# Patient Record
Sex: Female | Born: 1945 | Race: Black or African American | Hispanic: No | Smoking: Never smoker
Health system: Southern US, Community
[De-identification: ages and names within clinical notes are randomized; demographics above are authoritative.]

## PROBLEM LIST (undated history)

## (undated) DIAGNOSIS — Z860100 Personal history of colon polyps, unspecified: Secondary | ICD-10-CM

## (undated) DIAGNOSIS — I10 Essential (primary) hypertension: Secondary | ICD-10-CM

## (undated) DIAGNOSIS — R12 Heartburn: Secondary | ICD-10-CM

## (undated) DIAGNOSIS — G510 Bell's palsy: Secondary | ICD-10-CM

## (undated) DIAGNOSIS — M353 Polymyalgia rheumatica: Secondary | ICD-10-CM

## (undated) DIAGNOSIS — E785 Hyperlipidemia, unspecified: Secondary | ICD-10-CM

## (undated) HISTORY — DX: Polymyalgia rheumatica: M35.3

## (undated) HISTORY — PX: COLONOSCOPY: SHX174

## (undated) HISTORY — DX: Essential (primary) hypertension: I10

## (undated) HISTORY — DX: Bell's palsy: G51.0

## (undated) HISTORY — PX: ABDOMINAL HYSTERECTOMY: SHX81

## (undated) HISTORY — DX: Hyperlipidemia, unspecified: E78.5

## (undated) HISTORY — DX: Personal history of colon polyps, unspecified: Z86.0100

## (undated) HISTORY — DX: Heartburn: R12

## (undated) HISTORY — PX: PARTIAL HYSTERECTOMY: SHX80

## (undated) HISTORY — DX: Personal history of colonic polyps: Z86.010

---

## 2001-07-27 ENCOUNTER — Ambulatory Visit (HOSPITAL_COMMUNITY): Admission: RE | Admit: 2001-07-27 | Discharge: 2001-07-27 | Payer: Self-pay | Admitting: *Deleted

## 2001-07-27 ENCOUNTER — Encounter: Payer: Self-pay | Admitting: *Deleted

## 2004-04-25 ENCOUNTER — Ambulatory Visit: Payer: Self-pay | Admitting: Family Medicine

## 2005-01-27 ENCOUNTER — Ambulatory Visit: Payer: Self-pay | Admitting: Family Medicine

## 2005-05-01 ENCOUNTER — Ambulatory Visit: Payer: Self-pay | Admitting: Family Medicine

## 2006-07-28 ENCOUNTER — Other Ambulatory Visit: Admission: RE | Admit: 2006-07-28 | Discharge: 2006-07-28 | Payer: Self-pay | Admitting: Family Medicine

## 2006-08-03 ENCOUNTER — Encounter: Admission: RE | Admit: 2006-08-03 | Discharge: 2006-08-03 | Payer: Self-pay | Admitting: Family Medicine

## 2007-08-05 ENCOUNTER — Encounter: Admission: RE | Admit: 2007-08-05 | Discharge: 2007-08-05 | Payer: Self-pay | Admitting: Family Medicine

## 2008-08-07 ENCOUNTER — Encounter: Admission: RE | Admit: 2008-08-07 | Discharge: 2008-08-07 | Payer: Self-pay | Admitting: Family Medicine

## 2009-08-20 ENCOUNTER — Encounter: Admission: RE | Admit: 2009-08-20 | Discharge: 2009-08-20 | Payer: Self-pay | Admitting: Family Medicine

## 2009-09-27 ENCOUNTER — Other Ambulatory Visit: Admission: RE | Admit: 2009-09-27 | Discharge: 2009-09-27 | Payer: Self-pay | Admitting: Family Medicine

## 2010-08-02 ENCOUNTER — Other Ambulatory Visit: Payer: Self-pay | Admitting: Family Medicine

## 2010-08-02 DIAGNOSIS — Z1231 Encounter for screening mammogram for malignant neoplasm of breast: Secondary | ICD-10-CM

## 2010-08-22 ENCOUNTER — Ambulatory Visit
Admission: RE | Admit: 2010-08-22 | Discharge: 2010-08-22 | Disposition: A | Discharge status: Home / Self Care | Payer: Private Health Insurance - Indemnity | Source: Ambulatory Visit | Attending: Family Medicine | Admitting: Family Medicine

## 2010-08-22 DIAGNOSIS — Z1231 Encounter for screening mammogram for malignant neoplasm of breast: Secondary | ICD-10-CM

## 2011-08-06 ENCOUNTER — Other Ambulatory Visit: Payer: Self-pay | Admitting: Family Medicine

## 2011-08-06 DIAGNOSIS — Z1231 Encounter for screening mammogram for malignant neoplasm of breast: Secondary | ICD-10-CM

## 2011-08-26 ENCOUNTER — Ambulatory Visit: Payer: Private Health Insurance - Indemnity

## 2011-09-09 ENCOUNTER — Ambulatory Visit
Admission: RE | Admit: 2011-09-09 | Discharge: 2011-09-09 | Disposition: A | Discharge status: Home / Self Care | Payer: Self-pay | Source: Ambulatory Visit | Attending: Family Medicine | Admitting: Family Medicine

## 2011-09-09 DIAGNOSIS — Z1231 Encounter for screening mammogram for malignant neoplasm of breast: Secondary | ICD-10-CM

## 2012-09-20 ENCOUNTER — Other Ambulatory Visit: Payer: Self-pay

## 2012-09-20 DIAGNOSIS — Z1231 Encounter for screening mammogram for malignant neoplasm of breast: Secondary | ICD-10-CM

## 2012-10-21 ENCOUNTER — Ambulatory Visit
Admission: RE | Admit: 2012-10-21 | Discharge: 2012-10-21 | Disposition: A | Discharge status: Home / Self Care | Payer: Managed Care, Other (non HMO) | Source: Ambulatory Visit

## 2012-10-21 DIAGNOSIS — Z1231 Encounter for screening mammogram for malignant neoplasm of breast: Secondary | ICD-10-CM

## 2013-10-05 ENCOUNTER — Other Ambulatory Visit: Payer: Self-pay

## 2013-10-05 DIAGNOSIS — Z1231 Encounter for screening mammogram for malignant neoplasm of breast: Secondary | ICD-10-CM

## 2013-10-26 ENCOUNTER — Ambulatory Visit
Admission: RE | Admit: 2013-10-26 | Discharge: 2013-10-26 | Disposition: A | Discharge status: Home / Self Care | Payer: Medicare HMO | Source: Ambulatory Visit

## 2013-10-26 DIAGNOSIS — Z1231 Encounter for screening mammogram for malignant neoplasm of breast: Secondary | ICD-10-CM

## 2014-02-16 ENCOUNTER — Encounter: Payer: Self-pay | Admitting: General Surgery

## 2014-02-16 DIAGNOSIS — E785 Hyperlipidemia, unspecified: Secondary | ICD-10-CM | POA: Insufficient documentation

## 2014-06-08 ENCOUNTER — Ambulatory Visit (INDEPENDENT_AMBULATORY_CARE_PROVIDER_SITE_OTHER): Payer: Medicare Other | Admitting: Cardiology

## 2014-06-08 ENCOUNTER — Encounter: Payer: Self-pay | Admitting: Cardiology

## 2014-06-08 VITALS — BP 142/78 | HR 67 | Ht 64.0 in | Wt 158.0 lb

## 2014-06-08 DIAGNOSIS — R001 Bradycardia, unspecified: Secondary | ICD-10-CM

## 2014-06-08 DIAGNOSIS — E785 Hyperlipidemia, unspecified: Secondary | ICD-10-CM

## 2014-06-08 DIAGNOSIS — Z8249 Family history of ischemic heart disease and other diseases of the circulatory system: Secondary | ICD-10-CM

## 2014-06-08 NOTE — Patient Instructions (Signed)
The current medical regimen is effective;  continue present plan and medications.  Follow up in 1 year with Dr. Skains.  You will receive a letter in the mail 2 months before you are due.  Please call us when you receive this letter to schedule your follow up appointment.  Thank you for choosing [facility]!!     

## 2014-06-08 NOTE — Progress Notes (Signed)
[address] [place], [place]  [address] Phone: [phone] Fax:  [phone]  Date:  06/08/2014   ID:  Meredith Leach, DOB 1945/12/27, MRN [id number]  PCP:  Meredith Leach   History of Present Illness: Meredith Leach is a 69 y.o. female here for evaluation of bradycardia. Heart rate at last visit with Dr. Merri Brunetteandace Thal was 58 bpm EKG performed demonstrated heart rate of 49 bpm, sinus bradycardia with nonspecific ST-T wave changes. This was no change from prior EKG in 2010. She was referred to us to evaluate whether or not something different needs to be done  LDL 64, sodium 139, potassium 4.4, creatinine 0.7, hemoglobin 13.4   She is currently on no AV nodal blocking agents, no medications to slow her heart rate. She takes Crestor for her hyperlipidemia.  Mother has asymptomatic bradycardia with heart rate in the 30s at times.    Wt Readings from Last 3 Encounters:  06/08/14 158 lb (71.668 kg)     Past Medical History  Diagnosis Date  . Bell's palsy   . Hyperlipidemia   . History of colon polyps     Past Surgical History  Procedure Laterality Date  . Colonoscopy    . Partial hysterectomy      Current Outpatient Prescriptions  Medication Sig Dispense Refill  . aspirin 81 MG tablet Take 81 mg by mouth daily.    . calcium-vitamin D (OSCAL WITH D) 500-200 MG-UNIT per tablet Take 1 tablet by mouth daily with breakfast.    . CRESTOR 20 MG tablet Take 10 mg by mouth daily.  0  . estradiol (ESTRACE) 1 MG tablet Take 1 mg by mouth daily.    Marland Kitchen. omega-3 acid ethyl esters (LOVAZA) 1 G capsule Take 1 g by mouth daily.     No current facility-administered medications for this visit.    Allergies:   No Known Allergies  Social History:  The patient  reports that she has never smoked. She does not have any smokeless tobacco history on file. She reports that she does not drink alcohol or use illicit drugs.   Family History  Problem Relation Age of  Onset  . Alzheimer's disease Mother   . CAD Mother   . COPD Father    Her brother had myocardial infarction and bypass 4 at age 68.  ROS:  Please see the history of present illness.  Positive for Bell's palsy. Recent tennis elbow. Denies any fevers, chills, orthopnea, PND, syncope   All other systems reviewed and negative.   PHYSICAL EXAM: VS:  BP 142/78 mmHg  Pulse 67  Ht 5\' 4"  (1.626 m)  Wt 158 lb (71.668 kg)  BMI 27.11 kg/m2 Well nourished, well developed, in no acute distress HEENT: normal, [place]/AT, EOMI Neck: no JVD, normal carotid upstroke, no bruit Cardiac:  normal S1, S2; RRR; no murmur Lungs:  clear to auscultation bilaterally, no wheezing, rhonchi or rales Abd: soft, nontender, no hepatomegaly, no bruits Ext: no edema, 2+ distal pulses Skin: warm and dry GU: deferred Neuro: Bell's palsy noted, AAO x 3  EKG:  04/24/14 Sinus bradycardia rate 49 with nonspecific ST-T wave changes  ASSESSMENT AND PLAN:  1. Bradycardia-asymptomatic. Bradycardia may have been precipitated by laying down during prior EKG. Vagal phenomenon. Overall, no high risk symptoms such as syncope. Continue to monitor for any signs or symptoms, shortness of breath, inability to raise her heart rate, etc. Her mother, age 68, has had  heart rates in the upper 30s at times, does not have a pacemaker. There may be a familial linked to her current condition. Avoid AV nodal blocking agent such as metoprolol, diltiazem. During next blood work, consider checking TSH if it is not been done recently. She will call me if any worrisome symptoms develop. But pressure mildly elevated today but upon recent check she states that it has been normalized. 2. One-year follow-up to monitor bradycardia.  Signed, Donato Schultz, [facility]  06/08/2014 2:49 PM

## 2014-09-27 ENCOUNTER — Other Ambulatory Visit: Payer: Self-pay

## 2014-09-27 DIAGNOSIS — Z1231 Encounter for screening mammogram for malignant neoplasm of breast: Secondary | ICD-10-CM

## 2014-10-31 ENCOUNTER — Ambulatory Visit
Admission: RE | Admit: 2014-10-31 | Discharge: 2014-10-31 | Disposition: A | Discharge status: Home / Self Care | Payer: Medicare Other | Source: Ambulatory Visit

## 2014-10-31 DIAGNOSIS — Z1231 Encounter for screening mammogram for malignant neoplasm of breast: Secondary | ICD-10-CM

## 2015-03-01 ENCOUNTER — Ambulatory Visit (INDEPENDENT_AMBULATORY_CARE_PROVIDER_SITE_OTHER): Payer: Medicare Other | Admitting: *Deleted

## 2015-03-01 DIAGNOSIS — Z23 Encounter for immunization: Secondary | ICD-10-CM | POA: Diagnosis not present

## 2015-09-24 ENCOUNTER — Other Ambulatory Visit: Payer: Self-pay

## 2015-09-24 DIAGNOSIS — Z1231 Encounter for screening mammogram for malignant neoplasm of breast: Secondary | ICD-10-CM

## 2015-11-01 ENCOUNTER — Ambulatory Visit
Admission: RE | Admit: 2015-11-01 | Discharge: 2015-11-01 | Disposition: A | Discharge status: Home / Self Care | Payer: Medicare Other | Source: Ambulatory Visit

## 2015-11-01 DIAGNOSIS — Z1231 Encounter for screening mammogram for malignant neoplasm of breast: Secondary | ICD-10-CM

## 2016-02-26 ENCOUNTER — Ambulatory Visit (INDEPENDENT_AMBULATORY_CARE_PROVIDER_SITE_OTHER): Payer: Medicare Other | Admitting: *Deleted

## 2016-02-26 DIAGNOSIS — Z23 Encounter for immunization: Secondary | ICD-10-CM | POA: Diagnosis not present

## 2016-09-25 ENCOUNTER — Other Ambulatory Visit: Payer: Self-pay | Admitting: Family Medicine

## 2016-09-25 DIAGNOSIS — Z1231 Encounter for screening mammogram for malignant neoplasm of breast: Secondary | ICD-10-CM

## 2016-11-03 ENCOUNTER — Ambulatory Visit
Admission: RE | Admit: 2016-11-03 | Discharge: 2016-11-03 | Disposition: A | Discharge status: Home / Self Care | Payer: Medicare Other | Source: Ambulatory Visit | Attending: Family Medicine | Admitting: Family Medicine

## 2016-11-03 DIAGNOSIS — Z1231 Encounter for screening mammogram for malignant neoplasm of breast: Secondary | ICD-10-CM

## 2017-03-17 ENCOUNTER — Ambulatory Visit (INDEPENDENT_AMBULATORY_CARE_PROVIDER_SITE_OTHER): Payer: Medicare Other | Admitting: *Deleted

## 2017-03-17 DIAGNOSIS — Z23 Encounter for immunization: Secondary | ICD-10-CM | POA: Diagnosis not present

## 2017-09-30 ENCOUNTER — Other Ambulatory Visit: Payer: Self-pay | Admitting: Family Medicine

## 2017-09-30 DIAGNOSIS — Z1231 Encounter for screening mammogram for malignant neoplasm of breast: Secondary | ICD-10-CM

## 2017-11-05 ENCOUNTER — Ambulatory Visit
Admission: RE | Admit: 2017-11-05 | Discharge: 2017-11-05 | Disposition: A | Discharge status: Home / Self Care | Payer: Medicare Other | Source: Ambulatory Visit | Attending: Family Medicine | Admitting: Family Medicine

## 2017-11-05 DIAGNOSIS — Z1231 Encounter for screening mammogram for malignant neoplasm of breast: Secondary | ICD-10-CM

## 2018-06-11 ENCOUNTER — Other Ambulatory Visit: Payer: Self-pay | Admitting: Family Medicine

## 2018-06-11 DIAGNOSIS — E2839 Other primary ovarian failure: Secondary | ICD-10-CM

## 2018-06-24 ENCOUNTER — Ambulatory Visit
Admission: RE | Admit: 2018-06-24 | Discharge: 2018-06-24 | Disposition: A | Discharge status: Home / Self Care | Payer: Medicare Other | Source: Ambulatory Visit | Attending: Family Medicine | Admitting: Family Medicine

## 2018-06-24 DIAGNOSIS — E2839 Other primary ovarian failure: Secondary | ICD-10-CM

## 2018-10-25 ENCOUNTER — Other Ambulatory Visit: Payer: Self-pay | Admitting: Family Medicine

## 2018-10-25 DIAGNOSIS — Z1231 Encounter for screening mammogram for malignant neoplasm of breast: Secondary | ICD-10-CM

## 2018-12-02 ENCOUNTER — Other Ambulatory Visit: Payer: Self-pay | Admitting: Family Medicine

## 2018-12-02 DIAGNOSIS — I999 Unspecified disorder of circulatory system: Secondary | ICD-10-CM

## 2018-12-09 ENCOUNTER — Ambulatory Visit
Admission: RE | Admit: 2018-12-09 | Discharge: 2018-12-09 | Disposition: A | Discharge status: Home / Self Care | Payer: Medicare Other | Source: Ambulatory Visit | Attending: Family Medicine | Admitting: Family Medicine

## 2018-12-09 ENCOUNTER — Other Ambulatory Visit: Payer: Self-pay

## 2018-12-09 DIAGNOSIS — Z1231 Encounter for screening mammogram for malignant neoplasm of breast: Secondary | ICD-10-CM

## 2018-12-14 ENCOUNTER — Ambulatory Visit
Admission: RE | Admit: 2018-12-14 | Discharge: 2018-12-14 | Disposition: A | Discharge status: Home / Self Care | Payer: Medicare Other | Source: Ambulatory Visit | Attending: Family Medicine | Admitting: Family Medicine

## 2018-12-14 DIAGNOSIS — I999 Unspecified disorder of circulatory system: Secondary | ICD-10-CM

## 2019-11-07 ENCOUNTER — Other Ambulatory Visit: Payer: Self-pay | Admitting: Family Medicine

## 2019-11-07 DIAGNOSIS — Z1231 Encounter for screening mammogram for malignant neoplasm of breast: Secondary | ICD-10-CM

## 2019-12-13 ENCOUNTER — Ambulatory Visit
Admission: RE | Admit: 2019-12-13 | Discharge: 2019-12-13 | Disposition: A | Discharge status: Home / Self Care | Payer: Medicare Other | Source: Ambulatory Visit | Attending: Family Medicine | Admitting: Family Medicine

## 2019-12-13 ENCOUNTER — Other Ambulatory Visit: Payer: Self-pay

## 2019-12-13 DIAGNOSIS — Z1231 Encounter for screening mammogram for malignant neoplasm of breast: Secondary | ICD-10-CM

## 2020-07-03 DIAGNOSIS — Z0000 Encounter for general adult medical examination without abnormal findings: Secondary | ICD-10-CM | POA: Diagnosis not present

## 2020-07-03 DIAGNOSIS — I10 Essential (primary) hypertension: Secondary | ICD-10-CM | POA: Diagnosis not present

## 2020-07-03 DIAGNOSIS — Z1389 Encounter for screening for other disorder: Secondary | ICD-10-CM | POA: Diagnosis not present

## 2020-07-03 DIAGNOSIS — G519 Disorder of facial nerve, unspecified: Secondary | ICD-10-CM | POA: Diagnosis not present

## 2020-07-03 DIAGNOSIS — E7800 Pure hypercholesterolemia, unspecified: Secondary | ICD-10-CM | POA: Diagnosis not present

## 2020-07-23 DIAGNOSIS — Z1159 Encounter for screening for other viral diseases: Secondary | ICD-10-CM | POA: Diagnosis not present

## 2020-11-02 ENCOUNTER — Other Ambulatory Visit: Payer: Self-pay | Admitting: Family Medicine

## 2020-11-02 DIAGNOSIS — Z1231 Encounter for screening mammogram for malignant neoplasm of breast: Secondary | ICD-10-CM

## 2020-12-16 DIAGNOSIS — R21 Rash and other nonspecific skin eruption: Secondary | ICD-10-CM | POA: Diagnosis not present

## 2020-12-16 DIAGNOSIS — L299 Pruritus, unspecified: Secondary | ICD-10-CM | POA: Diagnosis not present

## 2020-12-26 ENCOUNTER — Other Ambulatory Visit: Payer: Self-pay

## 2020-12-26 ENCOUNTER — Ambulatory Visit
Admission: RE | Admit: 2020-12-26 | Discharge: 2020-12-26 | Disposition: A | Discharge status: Home / Self Care | Payer: Medicare Other | Source: Ambulatory Visit

## 2020-12-26 DIAGNOSIS — Z1231 Encounter for screening mammogram for malignant neoplasm of breast: Secondary | ICD-10-CM

## 2020-12-31 ENCOUNTER — Other Ambulatory Visit: Payer: Self-pay | Admitting: Family Medicine

## 2020-12-31 DIAGNOSIS — E7800 Pure hypercholesterolemia, unspecified: Secondary | ICD-10-CM | POA: Diagnosis not present

## 2020-12-31 DIAGNOSIS — R928 Other abnormal and inconclusive findings on diagnostic imaging of breast: Secondary | ICD-10-CM

## 2020-12-31 DIAGNOSIS — I10 Essential (primary) hypertension: Secondary | ICD-10-CM | POA: Diagnosis not present

## 2020-12-31 DIAGNOSIS — G479 Sleep disorder, unspecified: Secondary | ICD-10-CM | POA: Diagnosis not present

## 2021-01-11 ENCOUNTER — Other Ambulatory Visit: Payer: Self-pay

## 2021-01-11 ENCOUNTER — Other Ambulatory Visit: Payer: Self-pay | Admitting: Family Medicine

## 2021-01-11 ENCOUNTER — Ambulatory Visit
Admission: RE | Admit: 2021-01-11 | Discharge: 2021-01-11 | Disposition: A | Discharge status: Home / Self Care | Payer: Medicare Other | Source: Ambulatory Visit | Attending: Family Medicine | Admitting: Family Medicine

## 2021-01-11 DIAGNOSIS — R928 Other abnormal and inconclusive findings on diagnostic imaging of breast: Secondary | ICD-10-CM

## 2021-01-11 DIAGNOSIS — R922 Inconclusive mammogram: Secondary | ICD-10-CM | POA: Diagnosis not present

## 2021-01-11 DIAGNOSIS — N6320 Unspecified lump in the left breast, unspecified quadrant: Secondary | ICD-10-CM

## 2021-01-14 DIAGNOSIS — G4752 REM sleep behavior disorder: Secondary | ICD-10-CM | POA: Diagnosis not present

## 2021-01-30 DIAGNOSIS — G4710 Hypersomnia, unspecified: Secondary | ICD-10-CM | POA: Diagnosis not present

## 2021-01-30 DIAGNOSIS — G4752 REM sleep behavior disorder: Secondary | ICD-10-CM | POA: Diagnosis not present

## 2021-02-14 ENCOUNTER — Encounter (HOSPITAL_BASED_OUTPATIENT_CLINIC_OR_DEPARTMENT_OTHER): Payer: Self-pay

## 2021-02-14 DIAGNOSIS — G478 Other sleep disorders: Secondary | ICD-10-CM

## 2021-02-18 ENCOUNTER — Other Ambulatory Visit: Payer: Self-pay

## 2021-02-18 ENCOUNTER — Ambulatory Visit: Payer: Medicare Other | Attending: Internal Medicine | Admitting: Internal Medicine

## 2021-02-18 DIAGNOSIS — G4752 REM sleep behavior disorder: Secondary | ICD-10-CM

## 2021-02-18 DIAGNOSIS — G478 Other sleep disorders: Secondary | ICD-10-CM

## 2021-03-06 DIAGNOSIS — G4752 REM sleep behavior disorder: Secondary | ICD-10-CM | POA: Diagnosis not present

## 2021-03-11 NOTE — Procedures (Signed)
NAME: Meredith Leach DATE OF BIRTH:  [birth year] MEDICAL RECORD NUMBER [id number]  LOCATION: [facility]  PHYSICIAN: Deretha Emory  DATE OF STUDY: 02/18/2021  SLEEP STUDY TYPE: Nocturnal Polysomnogram               REFERRING PHYSICIAN: Deretha Emory, MD  EPWORTH SLEEPINESS SCORE:  6 HEIGHT:    WEIGHT:      There is no height or weight on file to calculate BMI.  NECK SIZE: 15.5 in.  CLINICAL INFORMATION The patient was referred to the sleep center for evaluation of dream enactment. She has had a negative HSAT previously.   MEDICATIONS Patient self administered medications include: None. No medications administered during study. The patient does not take SSRIs or any other meds associated with dream enactment.  SLEEP STUDY TECHNIQUE A multi-channel overnight Polysomnography study was performed. The channels recorded and monitored were central and occipital EEG, electrooculogram (EOG), submentalis EMG (chin), nasal and oral airflow, thoracic and abdominal wall motion, anterior tibialis EMG, snore microphone, electrocardiogram, and a pulse oximetry.  TECHNICAL COMMENTS Comments added by Technician: "RBD noticed in each REM stage, clonic and phasic movement in all EMG leads. Sleep talking noticed throughout, mainly in REM stages. Events increased in SUPINE postiion, associated with intermittently snoring arousals. Increased EEG arousals observed throughout study. EKG = Bradycardia. Snoring remarked to be mild to moderate, increased to moderate in supine position" Comments added by Scorer: N/A  SLEEP ARCHITECTURE The study was initiated at 10:37:53 PM and terminated at 5:53:23 AM. The total recorded time was 435.5 minutes. EEG confirmed total sleep time was 393.2 minutes yielding a sleep efficiency of 90.3%. Sleep onset after lights out was 15.3 minutes with a REM latency of 73.0 minutes. The patient spent 1.27% of the night in stage N1 sleep, 64.02% in stage N2  sleep, 18.44% in stage N3 and 16.3% in REM. Wake after sleep onset (WASO) was 27.0 minutes. The Arousal Index was 16.9/hour.  RESPIRATORY PARAMETERS There were a total of 23 respiratory disturbances out of which 0 were apneas ( 0 obstructive, 0 mixed, 0 central) and 23 hypopneas. The apnea/hypopnea index (AHI) was 3.5 events/hour. The central sleep apnea index was 0 events/hour. The REM AHI was 0.0 events/hour and NREM AHI was 4.2 events/hour. The supine AHI was 21.1 events/hour and the non supine AHI was 1.2 events/hour. She was supine during 11.6% of sleep. Respiratory disturbance index was 4.4 events/hour overall and 0 events/hour in REM sleep. Respiratory disturbances were associated with oxygen desaturation down to a nadir of 88.00% during sleep. The mean oxygen saturation during the study was 94.4%.  LEG MOVEMENT DATA The total leg movements were 187 with a resulting leg movement index of 29/hr . Associated arousal with leg movement index was 4.4/hr. Leg movements were predominantly in REM.  Chin atonia was absent in REM. In fact, highest chin tone qualitatitively was in REM sleep.   CARDIAC DATA The underlying cardiac rhythm was most consistent with sinus rhythm. Mean heart rate during sleep was 54.58 bpm. Additional rhythm abnormalities include None.  IMPRESSIONS - No Significant Obstructive Sleep Apnea (OSA) - Absence of REM atonia. - Moderate periodic leg movements(PLMs) during sleep. However, no significant associated arousals. LMs were more apparent in REM sleep.   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 However, breathing events were not associated with REM movements. - Consider melatonin as first line treatment  for RBD  Deretha Emory Sleep specialist, American Board of Internal Medicine  ELECTRONICALLY SIGNED ON:  03/11/2021, 7:28 PM [facility] PH: [phone]   FX: [phone] ACCREDITED BY THE AMERICAN ACADEMY OF SLEEP MEDICINE

## 2021-03-26 DIAGNOSIS — H524 Presbyopia: Secondary | ICD-10-CM | POA: Diagnosis not present

## 2021-03-26 DIAGNOSIS — H43813 Vitreous degeneration, bilateral: Secondary | ICD-10-CM | POA: Diagnosis not present

## 2021-03-26 DIAGNOSIS — Z961 Presence of intraocular lens: Secondary | ICD-10-CM | POA: Diagnosis not present

## 2021-05-02 DIAGNOSIS — G4752 REM sleep behavior disorder: Secondary | ICD-10-CM | POA: Diagnosis not present

## 2021-07-08 DIAGNOSIS — Z1389 Encounter for screening for other disorder: Secondary | ICD-10-CM | POA: Diagnosis not present

## 2021-07-08 DIAGNOSIS — Z0000 Encounter for general adult medical examination without abnormal findings: Secondary | ICD-10-CM | POA: Diagnosis not present

## 2021-07-08 DIAGNOSIS — E7800 Pure hypercholesterolemia, unspecified: Secondary | ICD-10-CM | POA: Diagnosis not present

## 2021-07-08 DIAGNOSIS — R7303 Prediabetes: Secondary | ICD-10-CM | POA: Diagnosis not present

## 2021-07-08 DIAGNOSIS — Z1159 Encounter for screening for other viral diseases: Secondary | ICD-10-CM | POA: Diagnosis not present

## 2021-07-08 DIAGNOSIS — I10 Essential (primary) hypertension: Secondary | ICD-10-CM | POA: Diagnosis not present

## 2021-07-15 ENCOUNTER — Ambulatory Visit
Admission: RE | Admit: 2021-07-15 | Discharge: 2021-07-15 | Disposition: A | Discharge status: Home / Self Care | Payer: Medicare Other | Source: Ambulatory Visit | Attending: Family Medicine | Admitting: Family Medicine

## 2021-07-15 ENCOUNTER — Other Ambulatory Visit: Payer: Self-pay | Admitting: Family Medicine

## 2021-07-15 DIAGNOSIS — N6320 Unspecified lump in the left breast, unspecified quadrant: Secondary | ICD-10-CM

## 2021-07-15 DIAGNOSIS — N6324 Unspecified lump in the left breast, lower inner quadrant: Secondary | ICD-10-CM | POA: Diagnosis not present

## 2021-07-24 ENCOUNTER — Ambulatory Visit
Admission: RE | Admit: 2021-07-24 | Discharge: 2021-07-24 | Disposition: A | Discharge status: Home / Self Care | Payer: Medicare Other | Source: Ambulatory Visit | Attending: Family Medicine | Admitting: Family Medicine

## 2021-07-24 DIAGNOSIS — N6320 Unspecified lump in the left breast, unspecified quadrant: Secondary | ICD-10-CM

## 2021-07-24 DIAGNOSIS — N6324 Unspecified lump in the left breast, lower inner quadrant: Secondary | ICD-10-CM | POA: Diagnosis not present

## 2021-07-24 DIAGNOSIS — N6012 Diffuse cystic mastopathy of left breast: Secondary | ICD-10-CM | POA: Diagnosis not present

## 2021-07-24 HISTORY — PX: BREAST BIOPSY: SHX20

## 2021-08-19 DIAGNOSIS — M542 Cervicalgia: Secondary | ICD-10-CM | POA: Diagnosis not present

## 2021-08-19 DIAGNOSIS — N1831 Chronic kidney disease, stage 3a: Secondary | ICD-10-CM | POA: Diagnosis not present

## 2021-09-16 DIAGNOSIS — M79605 Pain in left leg: Secondary | ICD-10-CM | POA: Diagnosis not present

## 2021-09-16 DIAGNOSIS — M25552 Pain in left hip: Secondary | ICD-10-CM | POA: Diagnosis not present

## 2021-09-16 DIAGNOSIS — M25551 Pain in right hip: Secondary | ICD-10-CM | POA: Diagnosis not present

## 2021-09-16 DIAGNOSIS — M791 Myalgia, unspecified site: Secondary | ICD-10-CM | POA: Diagnosis not present

## 2021-09-16 DIAGNOSIS — M2550 Pain in unspecified joint: Secondary | ICD-10-CM | POA: Diagnosis not present

## 2021-09-16 DIAGNOSIS — M542 Cervicalgia: Secondary | ICD-10-CM | POA: Diagnosis not present

## 2021-09-16 DIAGNOSIS — R944 Abnormal results of kidney function studies: Secondary | ICD-10-CM | POA: Diagnosis not present

## 2021-10-06 NOTE — Progress Notes (Unsigned)
Office Visit Note  Patient: Meredith Leach             Date of Birth: [birth year]           MRN: [id number]             PCP: Carol Ada, MD Referring: Carol Ada, MD Visit Date: 10/07/2021   Subjective:  New Patient (Initial Visit) (Neck pain and stiffness)   History of Present Illness: Meredith Leach is a 76 y.o. female here for evaluation of severe joint and muscle pains with elevated ESR and CRP concerning for PMR. She had sudden onset of neck pain and and stiffness April 3rd for which she went to urgent care. This was treated with a shot and course of oral prednisone this improved her neck pain and stiffness greatly. After this she developed hip and knee pain with some pain radiating down her left leg. Symptoms are most severe first thing in the morning and takes 2 hours to loosen up. She also has aching pain at night and has not gotten a good night of sleep for about 6 weeks. Currently she takes ibuprofen every 3 hours to manage her joint and muscle pain. Her right wrist also became swollen and painful starting the day after using a hammer to drive some stakes.   Labs reviewed ANA neg RF 14 ESR 69 CRP 72.14  Activities of Daily Living:  Patient reports morning stiffness for 2 hours.   Patient Reports nocturnal pain.  Difficulty dressing/grooming: Reports Difficulty climbing stairs: Denies Difficulty getting out of chair: Reports Difficulty using hands for taps, buttons, cutlery, and/or writing: Reports  Review of Systems  Constitutional:  Positive for fatigue.  HENT:  Negative for mouth dryness.   Eyes:  Negative for dryness.  Respiratory:  Negative for shortness of breath.   Cardiovascular:  Negative for swelling in legs/feet.  Gastrointestinal:  Negative for constipation.  Endocrine: Positive for cold intolerance.  Genitourinary:  Negative for difficulty urinating.  Musculoskeletal:  Positive for joint pain, joint pain, joint swelling, muscle weakness, morning  stiffness and muscle tenderness.  Skin:  Negative for rash.  Allergic/Immunologic: Negative for susceptible to infections.  Neurological:  Positive for weakness.  Hematological:  Negative for bruising/bleeding tendency.  Psychiatric/Behavioral:  Positive for sleep disturbance.    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   Sedimentation rate elevation 10/07/2021   Bradycardia 06/08/2014   Family history of early CAD 06/08/2014   Hyperlipidemia 02/16/2014    Past Medical History:  Diagnosis Date   Bell's palsy    History of colon polyps    Hyperlipidemia     Family History  Problem Relation Age of Onset   Alzheimer's disease Mother    CAD Mother    COPD Father    Diabetes Sister    Diabetes Brother    Breast cancer Neg Hx    Past Surgical History:  Procedure Laterality Date   ABDOMINAL HYSTERECTOMY     COLONOSCOPY     PARTIAL HYSTERECTOMY     Social History   Social History Narrative   Not on file   Immunization History  Administered Date(s) Administered   Influenza, High Dose Seasonal PF 03/17/2017   Influenza,inj,Quad PF,6+ Mos 02/21/2014, 03/01/2015, 02/26/2016     Objective: Vital Signs: BP (!) 161/74 (BP Location: Right Arm, Patient Position: Sitting, Cuff Size: Normal)   Pulse 72  Resp 15   Ht $R'5\' 3"'kX$  (1.6 m)   Wt 143 lb (64.9 kg)   BMI 25.33 kg/m    Physical Exam Eyes:     Conjunctiva/sclera: Conjunctivae normal.  Cardiovascular:     Rate and Rhythm: Normal rate and regular rhythm.  Pulmonary:     Effort: Pulmonary effort is normal.     Breath sounds: Normal breath sounds.  Musculoskeletal:     Right lower leg: No edema.     Left lower leg: No edema.  Skin:    General: Skin is warm and dry.  Neurological:     Mental Status: She is alert.     Comments: Left sided facial palsy Left hip flexion strength 3/5  Psychiatric:        Mood and Affect:  Mood normal.     Musculoskeletal Exam:  Neck slightly decreased lateral rotation, tenderness to palpation at base of neck Shoulders slightly decreased abduction and external rotation b/l Elbows full ROM no tenderness or swelling Right wrist dorsal swelling, redness, heat, decreased ROM Decreased flexion ROM in right hand fingers 2-5 with pain, no palpable synovitis Left lateral hip tenderness, pain over quadriceps muscle and tendon, pain with internal rotation Knees full ROM no tenderness or swelling   Investigation: No additional findings.  Imaging: XR Cervical Spine 2 or 3 views  Result Date: 10/08/2021 X-ray cervical spine 2 views Degenerative appearing changes with disc space narrowing and anterior osteophytes most advanced at levels of C5-C7.  Appears to be some probable anterolisthesis of C7 on C8. Possibly some bridging or posterior fusion at C6-C7. There are some small anterior calcifications. 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  There are multiple cystic changes and mild first CMC joint degenerative arthritis.  MCP and PIP joint spaces appear normal.  There are some fairly mild appearing osteoarthritis changes of DIP joints.  No abnormal calcifications or erosions seen. 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  Shoulder is held in slightly high position but no visible swelling or calcifications in subacromial space.  AC joint appears normal.  Normal appearing internal and external rotation positioning. Impression Normal appearing shoulder x-ray  XR Shoulder Right  Result Date: 10/08/2021 X-ray right shoulder 4 views Glenohumeral joint space appears normal.  No  abnormal swelling or calcifications visible in subacromial space.  There is osteoarthritis in the [facility] joint with overall preserved joint space and no erosions.  Normal shoulder internal and external rotation positioning. Impression AC joint osteoarthritis otherwise unremarkable shoulder x-ray   Recent Labs: No results found for: WBC, HGB, PLT, NA, K, CL, CO2, GLUCOSE, BUN, CREATININE, BILITOT, ALKPHOS, AST, ALT, PROT, ALBUMIN, CALCIUM, GFRAA, QFTBGOLD, QFTBGOLDPLUS  Speciality Comments: No specialty comments available.  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  There are some peripheral joint complaints and asymmetric involvement that are slightly unusual.  We will recheck inflammatory markers also checking CCP  antibody work-up for new onset rheumatoid arthritis.  X-ray of bilateral shoulders in clinic today unremarkable except for right AC joint arthritis that would not explain current symptoms.  This would be consistent with bursitis or tendinitis as a cause of her current shoulder pain.  X-ray of right hand with mild osteoarthritis no bony abnormality underlying her wrist inflammation.  X-ray of the cervical spine showing degenerative arthritis.  For suspected PMR we will start on a therapeutic trial of prednisone starting at 20 mg daily titrating down by 5 mg/day/week plan to follow-up in about 2 weeks to reassess response to treatment.  Pain and swelling of right wrist  Unequivocal inflammation on exam today with heat redness swelling decreased range of motion not sure whether this is overuse related injury or associated with her other joint  pains at this time.  Sedimentation rate elevation  Repeating inflammatory markers for comparison to prior elevated levels.  No definite synovitis or other inflammatory changes on exam outside of the right wrist at the moment.  Orders: Orders Placed This Encounter  Procedures   XR Hand 2 View Right   XR Shoulder Left   XR Shoulder Right   XR Cervical Spine 2 or 3 views   Sedimentation rate   C-reactive protein   Cyclic citrul peptide antibody, IgG   Meds ordered this encounter  Medications   predniSONE (DELTASONE) 10 MG tablet    Sig: Take 2 tablets (20 mg total) by mouth daily with breakfast for 7 days, THEN 1.5 tablets (15 mg total) daily with breakfast for 7 days, THEN 1 tablet (10 mg total) daily with breakfast for 7 days.    Dispense:  32 tablet    Refill:  0     Follow-Up Instructions: Return in about 2 weeks (around 10/21/2021) for New pt ?PMR f/u 2wks.   Collier Salina, MD  Note - This record has been created using [employer].  Chart creation errors have been sought, but may not always  have been located. Such creation errors do not reflect on  the standard of medical care.

## 2021-10-07 ENCOUNTER — Ambulatory Visit: Payer: Medicare Other | Admitting: Internal Medicine

## 2021-10-07 ENCOUNTER — Ambulatory Visit (INDEPENDENT_AMBULATORY_CARE_PROVIDER_SITE_OTHER): Payer: Medicare Other

## 2021-10-07 ENCOUNTER — Encounter: Payer: Self-pay | Admitting: Internal Medicine

## 2021-10-07 VITALS — BP 161/74 | HR 72 | Resp 15 | Ht 63.0 in | Wt 143.0 lb

## 2021-10-07 DIAGNOSIS — R700 Elevated erythrocyte sedimentation rate: Secondary | ICD-10-CM | POA: Insufficient documentation

## 2021-10-07 DIAGNOSIS — M130 Polyarthritis, unspecified: Secondary | ICD-10-CM

## 2021-10-07 DIAGNOSIS — M25531 Pain in right wrist: Secondary | ICD-10-CM

## 2021-10-07 DIAGNOSIS — M25511 Pain in right shoulder: Secondary | ICD-10-CM | POA: Diagnosis not present

## 2021-10-07 DIAGNOSIS — M79641 Pain in right hand: Secondary | ICD-10-CM | POA: Diagnosis not present

## 2021-10-07 DIAGNOSIS — M25512 Pain in left shoulder: Secondary | ICD-10-CM | POA: Diagnosis not present

## 2021-10-07 DIAGNOSIS — M25431 Effusion, right wrist: Secondary | ICD-10-CM | POA: Diagnosis not present

## 2021-10-07 MED ORDER — PREDNISONE 10 MG PO TABS: ORAL_TABLET | ORAL | 0 refills | Status: AC

## 2021-10-08 LAB — CYCLIC CITRUL PEPTIDE ANTIBODY, IGG: Cyclic Citrullin Peptide Ab: <16 UNITS

## 2021-10-08 LAB — SEDIMENTATION RATE: Sed Rate: 79 mm/h — ABNORMAL HIGH (ref 0–30)

## 2021-10-08 LAB — C-REACTIVE PROTEIN: CRP: 19.7 mg/L — ABNORMAL HIGH (ref <8.0)

## 2021-10-15 NOTE — Progress Notes (Signed)
Office Visit Note  Patient: Meredith Leach             Date of Birth: [birth year]           MRN: [id number]             PCP: Carol Ada, MD Referring: Carol Ada, MD Visit Date: 10/22/2021   Subjective:   History of Present Illness: Meredith Leach is a 76 y.o. female here for follow up for severe joint and muscle pains with elevated ESR and CRP concerning for PMR. Imaging at the time showed degenerative changes in the lower cervical spine and mild hand osteoarthritis with normal shoulder xrays. She started prednisone taper at 20 mg daily down to 10 mg starting today. Symptoms improved dramatically by the 3rd day taking this. She now has no significant pain and stiffness is mostly better. Right wrist swelling is down as well.  Previous HPI 10/07/2021  Meredith Leach is a 76 y.o. female here for evaluation of severe joint and muscle pains with elevated ESR and CRP concerning for PMR. She had sudden onset of neck pain and and stiffness April 3rd for which she went to urgent care. This was treated with a shot and course of oral prednisone this improved her neck pain and stiffness greatly. After this she developed hip and knee pain with some pain radiating down her left leg. Symptoms are most severe first thing in the morning and takes 2 hours to loosen up. She also has aching pain at night and has not gotten a good night of sleep for about 6 weeks. Currently she takes ibuprofen every 3 hours to manage her joint and muscle pain. Her right wrist also became swollen and painful starting the day after using a hammer to drive some stakes.    Labs reviewed ANA neg RF 14 ESR 69 CRP 72.14   Review of Systems  Constitutional:  Negative for fatigue.  HENT:  Negative for mouth dryness.   Eyes:  Negative for dryness.  Respiratory:  Negative for shortness of breath.   Cardiovascular:  Negative for swelling in legs/feet.  Gastrointestinal:  Negative for constipation.  Endocrine: Negative for  excessive thirst.  Genitourinary:  Negative for difficulty urinating.  Musculoskeletal:  Negative for morning stiffness.  Skin:  Negative for rash.  Allergic/Immunologic: Negative for susceptible to infections.  Neurological:  Positive for weakness.  Hematological:  Negative for bruising/bleeding tendency.  Psychiatric/Behavioral:  Negative for sleep disturbance.    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   Sedimentation rate elevation 10/07/2021   Bradycardia 06/08/2014   Family history of early CAD 06/08/2014   Hyperlipidemia 02/16/2014    Past Medical History:  Diagnosis Date   Bell's palsy    History of colon polyps    Hyperlipidemia     Family History  Problem Relation Age of Onset   Alzheimer's disease Mother    CAD Mother    COPD Father    Diabetes Sister    Diabetes Brother    Breast cancer Neg Hx    Past Surgical History:  Procedure Laterality Date   ABDOMINAL HYSTERECTOMY     COLONOSCOPY     PARTIAL HYSTERECTOMY     Social History   Social History Narrative   Not on file   Immunization History  Administered Date(s) Administered   Influenza, High Dose Seasonal PF 03/17/2017  Influenza,inj,Quad PF,6+ Mos 02/21/2014, 03/01/2015, 02/26/2016     Objective: Vital Signs: BP (!) 157/77 (BP Location: Left Arm, Patient Position: Sitting, Cuff Size: Small)   Pulse (!) 55   Resp 12   Ht 5' 3"  (1.6 m)   Wt 142 lb 12.8 oz (64.8 kg)   BMI 25.30 kg/m    Physical Exam Cardiovascular:     Rate and Rhythm: Normal rate and regular rhythm.  Pulmonary:     Effort: Pulmonary effort is normal.     Breath sounds: Normal breath sounds.  Musculoskeletal:     Right lower leg: No edema.     Left lower leg: No edema.  Skin:    General: Skin is warm and dry.  Neurological:     Mental Status: She is alert.  Psychiatric:        Mood and Affect: Mood normal.      Musculoskeletal Exam:  Neck full ROM no tenderness Shoulders full ROM no tenderness or swelling Elbows full ROM no tenderness or swelling Slight right wrist swelling with decreased extension ROM compared to left, no tenderness Fingers full ROM no tenderness or swelling Knees full ROM no tenderness or swelling Ankles full ROM no tenderness or swelling   Investigation: No additional findings.  Imaging: XR Cervical Spine 2 or 3 views  Result Date: 10/08/2021 X-ray cervical spine 2 views Degenerative appearing changes with disc space narrowing and anterior osteophytes most advanced at levels of C5-C7.  Appears to be some probable anterolisthesis of C7 on C8. Possibly some bridging or posterior fusion at C6-C7. There are some small anterior calcifications. 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  There are multiple cystic changes and mild first CMC joint degenerative arthritis.  MCP and PIP joint spaces appear normal.  There are some fairly mild appearing osteoarthritis changes of DIP joints.  No abnormal calcifications or erosions seen. 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  Shoulder is held in slightly high position but no visible swelling or calcifications in subacromial space.  AC joint appears normal.  Normal appearing internal and external rotation positioning. Impression Normal appearing shoulder x-ray  XR Shoulder Right  Result Date: 10/08/2021 X-ray right shoulder 4 views Glenohumeral joint space appears normal.  No abnormal swelling or calcifications visible in subacromial space.  There is osteoarthritis in the [facility] joint with overall preserved joint space and no erosions.   Normal shoulder internal and external rotation positioning. Impression AC joint osteoarthritis otherwise unremarkable shoulder x-ray   Recent Labs: No results found for: WBC, HGB, PLT, NA, K, CL, CO2, GLUCOSE, BUN, CREATININE, BILITOT, ALKPHOS, AST, ALT, PROT, ALBUMIN, CALCIUM, GFRAA, QFTBGOLD, QFTBGOLDPLUS  Speciality Comments: No specialty comments available.  Procedures:  No procedures performed Allergies: Hydrocodone-acetaminophen   Assessment / Plan:     Visit Diagnoses: Polymyalgia rheumatica ([place]) - prednisone starting at 20 mg daily titrating down by 5 mg/day/week - Plan: Sedimentation rate, C-reactive protein, BASIC METABOLIC PANEL WITH GFR, predniSONE (DELTASONE) 5 MG tablet  Symptoms now greatly improved and findings all look consistent with PMR. Not sure about any specific cause or trigger, possibly use related or her mild right wrist trauma. Rechecking inflammatory markers today. Checking metabolic panel for monitoring on possibly long  term corticosteroids. No complaints suggestive for temporal arteritis or cardiovascular involvement. Tapering prednisone down by 2.5 mg/day dose weekly starting at 10 mg this week. Can go back a step if having symptoms.  Pain and swelling of right wrist  Swelling in wrist is also getting better on prednisone. Exact cause remains unclear if injury related, peripheral joint involvement in PMR, or inflammatory arthritis.  Orders: Orders Placed This Encounter  Procedures   Sedimentation rate   C-reactive protein   BASIC METABOLIC PANEL WITH GFR   Meds ordered this encounter  Medications   predniSONE (DELTASONE) 5 MG tablet    Sig: Take 1.5 tablets (7.5 mg total) by mouth daily with breakfast for 7 days, THEN 1 tablet (5 mg total) daily with breakfast for 7 days, THEN 0.5 tablets (2.5 mg total) daily with breakfast for 7 days.    Dispense:  21 tablet    Refill:  0     Follow-Up Instructions: Return in about 10 weeks (around 12/31/2021) for  PMR on GC taper f/u ~10wks.   Collier Salina, MD  Note - This record has been created using [employer].  Chart creation errors have been sought, but may not always  have been located. Such creation errors do not reflect on  the standard of medical care.

## 2021-10-22 ENCOUNTER — Encounter: Payer: Self-pay | Admitting: Internal Medicine

## 2021-10-22 ENCOUNTER — Ambulatory Visit: Payer: Medicare Other | Admitting: Internal Medicine

## 2021-10-22 VITALS — BP 157/77 | HR 55 | Resp 12 | Ht 63.0 in | Wt 142.8 lb

## 2021-10-22 DIAGNOSIS — M25531 Pain in right wrist: Secondary | ICD-10-CM | POA: Diagnosis not present

## 2021-10-22 DIAGNOSIS — M25431 Effusion, right wrist: Secondary | ICD-10-CM | POA: Diagnosis not present

## 2021-10-22 DIAGNOSIS — R700 Elevated erythrocyte sedimentation rate: Secondary | ICD-10-CM

## 2021-10-22 DIAGNOSIS — M353 Polymyalgia rheumatica: Secondary | ICD-10-CM

## 2021-10-22 DIAGNOSIS — M130 Polyarthritis, unspecified: Secondary | ICD-10-CM

## 2021-10-22 MED ORDER — PREDNISONE 5 MG PO TABS: ORAL_TABLET | ORAL | 0 refills | Status: DC

## 2021-10-23 LAB — BASIC METABOLIC PANEL WITH GFR
BUN: 17 mg/dL (ref 7–25)
CO2: 25 mmol/L (ref 20–32)
Calcium: 10 mg/dL (ref 8.6–10.4)
Chloride: 104 mmol/L (ref 98–110)
Creat: 0.73 mg/dL (ref 0.60–1.00)
Glucose, Bld: 116 mg/dL — ABNORMAL HIGH (ref 65–99)
Potassium: 4.5 mmol/L (ref 3.5–5.3)
Sodium: 137 mmol/L (ref 135–146)
eGFR: 86 mL/min/{1.73_m2} (ref >=60)

## 2021-10-23 LAB — C-REACTIVE PROTEIN: CRP: 0.9 mg/L (ref <8.0)

## 2021-10-23 LAB — SEDIMENTATION RATE: Sed Rate: 22 mm/h (ref 0–30)

## 2021-10-23 NOTE — Progress Notes (Signed)
Lab results look good her inflammatory markers decreased to normal after starting the prednisone. She can continue the tapering plan as discussed.

## 2021-10-24 DIAGNOSIS — G4752 REM sleep behavior disorder: Secondary | ICD-10-CM | POA: Diagnosis not present

## 2021-10-25 ENCOUNTER — Other Ambulatory Visit: Payer: Self-pay | Admitting: Internal Medicine

## 2021-10-25 DIAGNOSIS — M130 Polyarthritis, unspecified: Secondary | ICD-10-CM

## 2021-10-28 ENCOUNTER — Ambulatory Visit: Payer: Medicare Other | Admitting: Internal Medicine

## 2021-11-11 ENCOUNTER — Other Ambulatory Visit: Payer: Self-pay | Admitting: Internal Medicine

## 2021-11-11 DIAGNOSIS — M353 Polymyalgia rheumatica: Secondary | ICD-10-CM

## 2021-11-18 DIAGNOSIS — D508 Other iron deficiency anemias: Secondary | ICD-10-CM | POA: Diagnosis not present

## 2021-11-30 ENCOUNTER — Other Ambulatory Visit: Payer: Self-pay | Admitting: Internal Medicine

## 2021-11-30 DIAGNOSIS — M353 Polymyalgia rheumatica: Secondary | ICD-10-CM

## 2021-12-02 NOTE — Telephone Encounter (Signed)
Next Visit: 12/30/2021   Last Visit: 10/22/2021   Last Fill: 11/11/2021   Dx: Polymyalgia rheumatica    Current Dose per office note on 10/22/2021: Tapering prednisone down by 2.5 mg/day dose weekly starting at 10 mg this week. Can go back a step if having symptoms.   Okay to refill Prednisone?

## 2021-12-09 ENCOUNTER — Other Ambulatory Visit: Payer: Self-pay | Admitting: Family Medicine

## 2021-12-09 DIAGNOSIS — N6011 Diffuse cystic mastopathy of right breast: Secondary | ICD-10-CM

## 2021-12-18 NOTE — Progress Notes (Signed)
Office Visit Note  Patient: Meredith Leach             Date of Birth: Mar 30, 1946           MRN: [id number]             PCP: Carol Ada, MD Referring: Carol Ada, MD Visit Date: 12/30/2021   Subjective:  Follow-up (Muscle pain is gone but knees and right shoulders and finger joints hurt.)   History of Present Illness: Meredith Leach is a 76 y.o. female here for follow up for severe joint and muscle pains with elevated ESR and CRP concerning for PMR. She tapered off the prednisone entirely without any severe symptom exacerbation. Her muscle pains remain better but she has seen an increase in right shoulder, both hands, bilateral hip, and knee pains. These are typically mild and improve with use of ibuprofen. She notices pain more after prolonged activity or exertion  Previous HPI 10/22/2021 [name] is a 76 y.o. female here for follow up for severe joint and muscle pains with elevated ESR and CRP concerning for PMR. Imaging at the time showed degenerative changes in the lower cervical spine and mild hand osteoarthritis with normal shoulder xrays. She started prednisone taper at 20 mg daily down to 10 mg starting today. Symptoms improved dramatically by the 3rd day taking this. She now has no significant pain and stiffness is mostly better. Right wrist swelling is down as well.   Previous HPI 10/07/2021  [name] is a 76 y.o. female here for evaluation of severe joint and muscle pains with elevated ESR and CRP concerning for PMR. She had sudden onset of neck pain and and stiffness April 3rd for which she went to urgent care. This was treated with a shot and course of oral prednisone this improved her neck pain and stiffness greatly. After this she developed hip and knee pain with some pain radiating down her left leg. Symptoms are most severe first thing in the morning and takes 2 hours to loosen up. She also has aching pain at night and has not gotten a good night of sleep for  about 6 weeks. Currently she takes ibuprofen every 3 hours to manage her joint and muscle pain. Her right wrist also became swollen and painful starting the day after using a hammer to drive some stakes.    Labs reviewed ANA neg RF 14 ESR 69 CRP 72.14   Review of Systems  Constitutional:  Negative for fatigue.  HENT:  Negative for mouth sores and mouth dryness.   Eyes:  Positive for dryness.  Respiratory:  Negative for shortness of breath.   Cardiovascular:  Negative for chest pain and palpitations.  Gastrointestinal:  Negative for blood in stool, constipation and diarrhea.  Endocrine: Negative for increased urination.  Genitourinary:  Negative for involuntary urination.  Musculoskeletal:  Positive for joint pain, joint pain and morning stiffness. Negative for joint swelling, myalgias, muscle weakness, muscle tenderness and myalgias.  Skin:  Negative for color change, rash, hair loss and sensitivity to sunlight.  Allergic/Immunologic: Negative for susceptible to infections.  Neurological:  Negative for dizziness and headaches.  Hematological:  Negative for swollen glands.  Psychiatric/Behavioral:  Negative for depressed mood and sleep disturbance. The patient is not nervous/anxious.     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  Sedimentation rate elevation 10/07/2021   Bradycardia 06/08/2014   Family history of early CAD 06/08/2014   Hyperlipidemia 02/16/2014    Past Medical History:  Diagnosis Date   Bell's palsy    History of colon polyps    Hyperlipidemia    PMR (polymyalgia rheumatica) (HCC)     Family History  Problem Relation Age of Onset   Alzheimer's disease Mother    CAD Mother    COPD Father    Diabetes Sister    Diabetes Brother    Breast cancer Neg Hx    Past Surgical History:  Procedure Laterality Date    ABDOMINAL HYSTERECTOMY     COLONOSCOPY     PARTIAL HYSTERECTOMY     Social History   Social History Narrative   Not on file   Immunization History  Administered Date(s) Administered   Influenza, High Dose Seasonal PF 03/17/2017   Influenza,inj,Quad PF,6+ Mos 02/21/2014, 03/01/2015, 02/26/2016   MODERNA COVID-19 SARS-COV-2 PEDS BIVALENT BOOSTER 6Y-11Y 06/30/2019, 07/29/2019, 03/26/2020     Objective: Vital Signs: BP 117/67 (BP Location: Right Arm, Patient Position: Sitting, Cuff Size: Normal)   Pulse (!) 55   Resp 14   Ht _0  (1.6 m)   Wt 142 lb (64.4 kg)   BMI 25.15 kg/m    Physical Exam Cardiovascular:     Rate and Rhythm: Normal rate and regular rhythm.  Pulmonary:     Effort: Pulmonary effort is normal.     Breath sounds: Normal breath sounds.  Musculoskeletal:     Right lower leg: No edema.     Left lower leg: No edema.  Skin:    General: Skin is warm and dry.     Findings: No rash.  Neurological:     Mental Status: She is alert.     Gait: Gait normal.  Psychiatric:        Mood and Affect: Mood normal.      Musculoskeletal Exam:  Neck full ROM no tenderness Shoulders full ROM lateral right shoulder tenderness to pressure, no swelling Elbows full ROM no tenderness or swelling Wrists full ROM no tenderness or swelling Fingers full ROM no tenderness or swelling No lateral hip tenderness at greater trochanter, some tenderness on back around SI joint and paraspinal muscle Knees full ROM no tenderness or swelling Ankles full ROM no tenderness or swelling   Investigation: No additional findings.  Imaging: No results found.  Recent Labs: Lab Results  Component Value Date   NA 137 10/22/2021   K 4.5 10/22/2021   CL 104 10/22/2021   CO2 25 10/22/2021   GLUCOSE 116 (H) 10/22/2021   BUN 17 10/22/2021   CREATININE 0.73 10/22/2021   CALCIUM 10.0 10/22/2021    Speciality Comments: No specialty comments available.  Procedures:  No procedures  performed Allergies: Hydrocodone-acetaminophen   Assessment / Plan:     Visit Diagnoses: Polymyalgia rheumatica ([place]) - Plan: Sedimentation rate, C-reactive protein  Proximal joint and muscle involvement mostly on exam today.  Not sure whether this represents any relapse of PMR inflammatory symptoms currently and is not very debilitating.  Plan to recheck sedimentation rate and CRP which were markedly elevated before treatment.  If these remain improved would not recommend restarting glucocorticoids and she is not finding symptoms so severe that she would want trial of additional medications for symptom management.  Pain and swelling of right wrist Polyarthritis  Peripheral joint symptoms doing well now and no inflammatory changes on exam.   Orders: Orders Placed  This Encounter  Procedures   Sedimentation rate   C-reactive protein   No orders of the defined types were placed in this encounter.    Follow-Up Instructions: No follow-ups on file.   Collier Salina, MD  Note - This record has been created using [employer].  Chart creation errors have been sought, but may not always  have been located. Such creation errors do not reflect on  the standard of medical care.

## 2021-12-30 ENCOUNTER — Encounter: Payer: Self-pay | Admitting: Internal Medicine

## 2021-12-30 ENCOUNTER — Ambulatory Visit: Payer: Medicare Other | Attending: Internal Medicine | Admitting: Internal Medicine

## 2021-12-30 VITALS — BP 117/67 | HR 55 | Resp 14 | Ht 63.0 in | Wt 142.0 lb

## 2021-12-30 DIAGNOSIS — R700 Elevated erythrocyte sedimentation rate: Secondary | ICD-10-CM | POA: Diagnosis not present

## 2021-12-30 DIAGNOSIS — M25531 Pain in right wrist: Secondary | ICD-10-CM

## 2021-12-30 DIAGNOSIS — M25431 Effusion, right wrist: Secondary | ICD-10-CM | POA: Diagnosis not present

## 2021-12-30 DIAGNOSIS — M130 Polyarthritis, unspecified: Secondary | ICD-10-CM

## 2021-12-30 DIAGNOSIS — M353 Polymyalgia rheumatica: Secondary | ICD-10-CM | POA: Diagnosis not present

## 2021-12-31 LAB — C-REACTIVE PROTEIN: CRP: 4.8 mg/L (ref <8.0)

## 2021-12-31 LAB — SEDIMENTATION RATE: Sed Rate: 19 mm/h (ref 0–30)

## 2022-01-01 NOTE — Progress Notes (Signed)
Current sedimentation rate and CRP tests have both remained normal off of medication.  These were both very high back in May when symptoms developed.  I recommend we can just monitor for now. If she sees a return of original problems or new joint pain we can follow-up as needed.

## 2022-01-07 ENCOUNTER — Ambulatory Visit: Payer: Medicare Other

## 2022-01-07 ENCOUNTER — Ambulatory Visit
Admission: RE | Admit: 2022-01-07 | Discharge: 2022-01-07 | Disposition: A | Discharge status: Home / Self Care | Payer: Medicare Other | Source: Ambulatory Visit | Attending: Family Medicine | Admitting: Family Medicine

## 2022-01-07 DIAGNOSIS — N6011 Diffuse cystic mastopathy of right breast: Secondary | ICD-10-CM

## 2022-01-07 DIAGNOSIS — R922 Inconclusive mammogram: Secondary | ICD-10-CM | POA: Diagnosis not present

## 2022-01-08 DIAGNOSIS — I10 Essential (primary) hypertension: Secondary | ICD-10-CM | POA: Diagnosis not present

## 2022-01-08 DIAGNOSIS — E7800 Pure hypercholesterolemia, unspecified: Secondary | ICD-10-CM | POA: Diagnosis not present

## 2022-01-08 DIAGNOSIS — R7303 Prediabetes: Secondary | ICD-10-CM | POA: Diagnosis not present

## 2022-01-08 DIAGNOSIS — N1831 Chronic kidney disease, stage 3a: Secondary | ICD-10-CM | POA: Diagnosis not present

## 2022-04-01 DIAGNOSIS — H524 Presbyopia: Secondary | ICD-10-CM | POA: Diagnosis not present

## 2022-04-01 DIAGNOSIS — H04123 Dry eye syndrome of bilateral lacrimal glands: Secondary | ICD-10-CM | POA: Diagnosis not present

## 2022-04-01 DIAGNOSIS — H43813 Vitreous degeneration, bilateral: Secondary | ICD-10-CM | POA: Diagnosis not present

## 2022-04-30 DIAGNOSIS — G4752 REM sleep behavior disorder: Secondary | ICD-10-CM | POA: Diagnosis not present

## 2022-08-07 DIAGNOSIS — I10 Essential (primary) hypertension: Secondary | ICD-10-CM | POA: Diagnosis not present

## 2022-08-07 DIAGNOSIS — E7800 Pure hypercholesterolemia, unspecified: Secondary | ICD-10-CM | POA: Diagnosis not present

## 2022-08-07 DIAGNOSIS — R7303 Prediabetes: Secondary | ICD-10-CM | POA: Diagnosis not present

## 2022-08-07 DIAGNOSIS — Z0000 Encounter for general adult medical examination without abnormal findings: Secondary | ICD-10-CM | POA: Diagnosis not present

## 2022-08-07 DIAGNOSIS — D508 Other iron deficiency anemias: Secondary | ICD-10-CM | POA: Diagnosis not present

## 2022-11-05 DIAGNOSIS — M353 Polymyalgia rheumatica: Secondary | ICD-10-CM | POA: Diagnosis not present

## 2022-11-05 DIAGNOSIS — L237 Allergic contact dermatitis due to plants, except food: Secondary | ICD-10-CM | POA: Diagnosis not present

## 2022-11-05 DIAGNOSIS — N1831 Chronic kidney disease, stage 3a: Secondary | ICD-10-CM | POA: Diagnosis not present

## 2022-11-25 ENCOUNTER — Other Ambulatory Visit: Payer: Self-pay | Admitting: Family Medicine

## 2022-11-25 DIAGNOSIS — Z1231 Encounter for screening mammogram for malignant neoplasm of breast: Secondary | ICD-10-CM

## 2022-12-30 IMAGING — MG MM DIGITAL SCREENING BILAT W/ TOMO AND CAD
8 series · 9 of 24 positions shown · non-contrast
Comparison: Previous exam(s).

CLINICAL DATA: Screening.

EXAM:
DIGITAL SCREENING BILATERAL MAMMOGRAM WITH TOMOSYNTHESIS AND CAD
TECHNIQUE: Bilateral screening digital craniocaudal and mediolateral oblique
mammograms were obtained. Bilateral screening digital breast
tomosynthesis was performed. The images were evaluated with
computer-aided detection.

[R CC synth-2D]
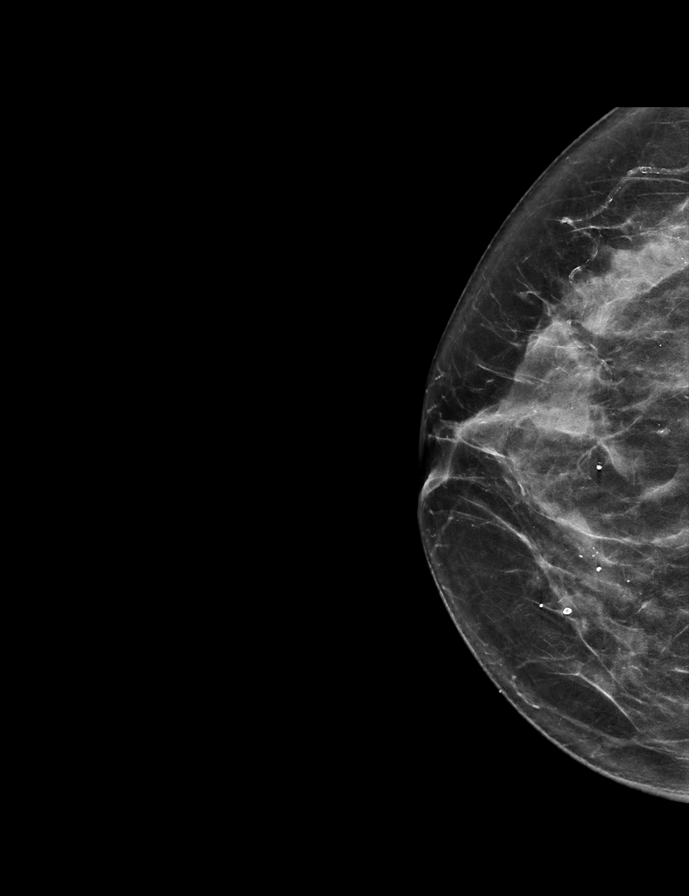

[R MLO synth-2D]
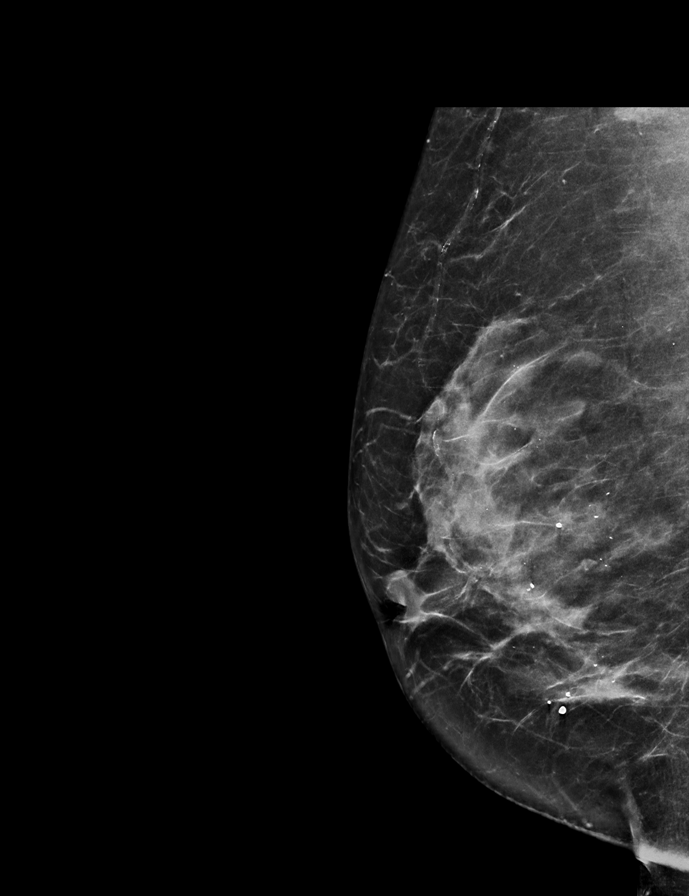

[L MLO synth-2D]
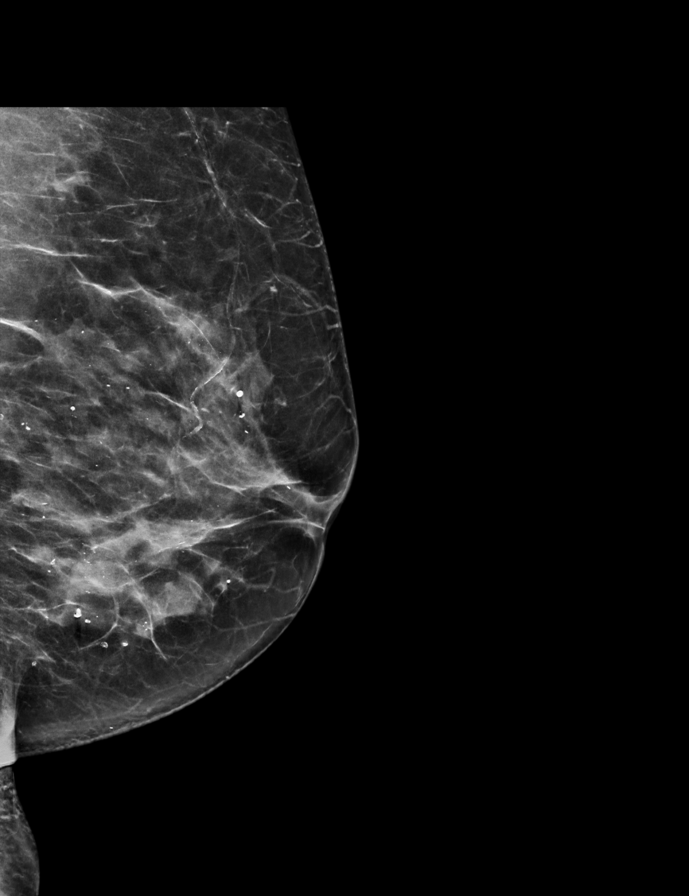

[L CC synth-2D]
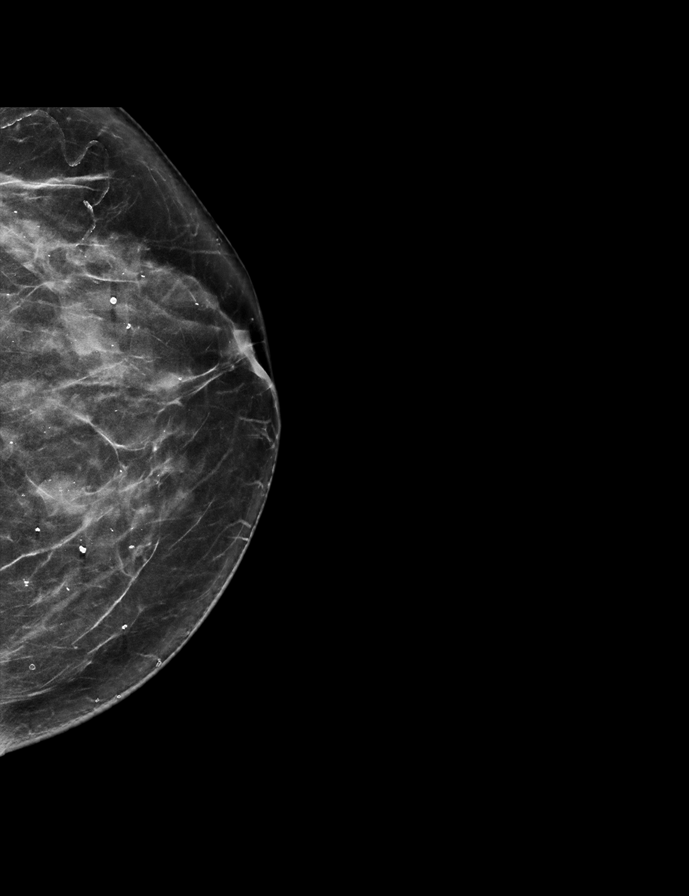

[R CC tomo · 2 of 63 frames shown]
[frame 21/63]
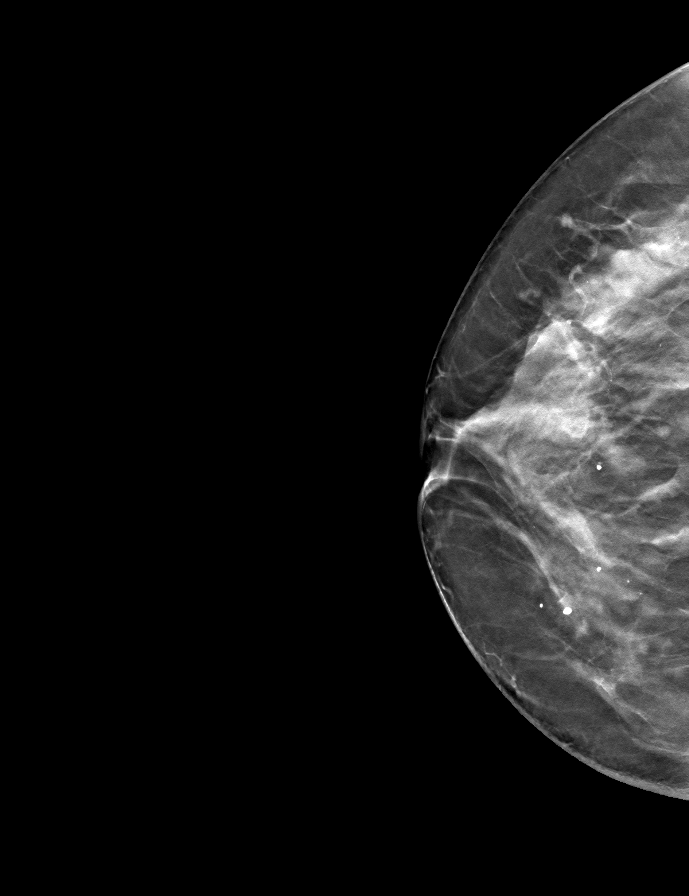
[frame 32/63]
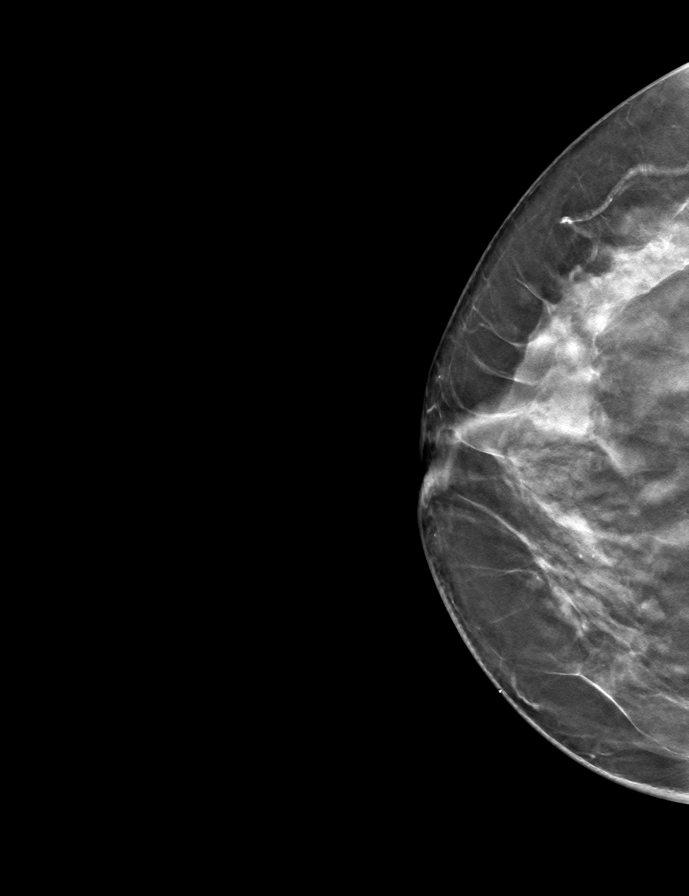

[L MLO tomo · tomo slice 32/63.0]
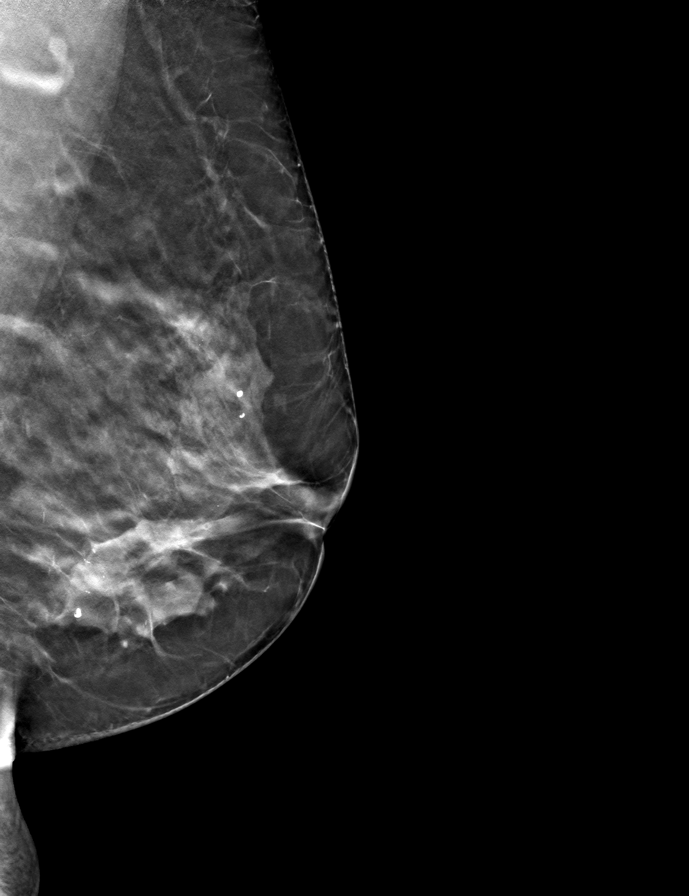

[R MLO tomo · tomo slice 33/66.0]
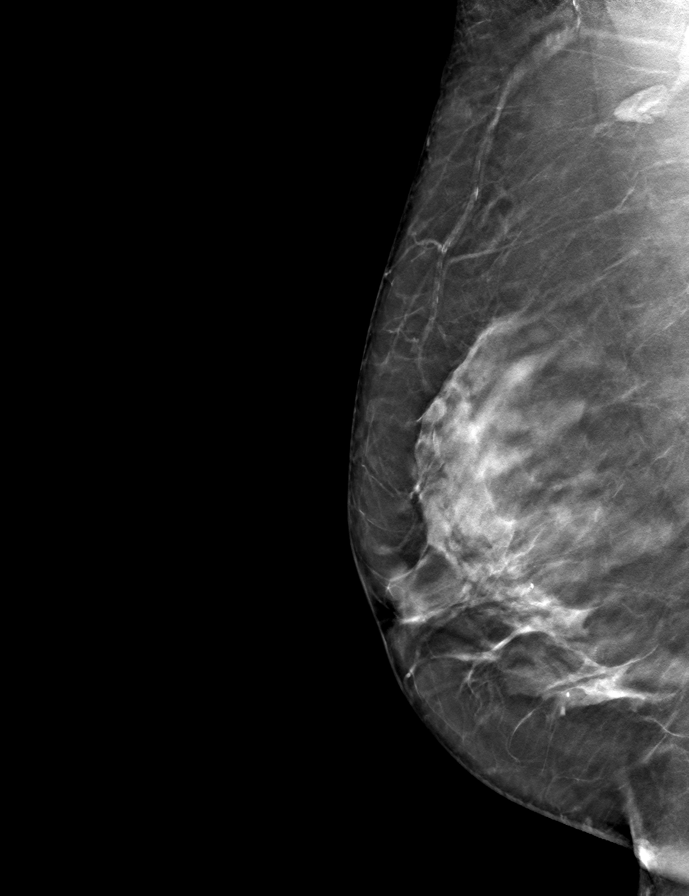

[L CC tomo · tomo slice 33/65.0]
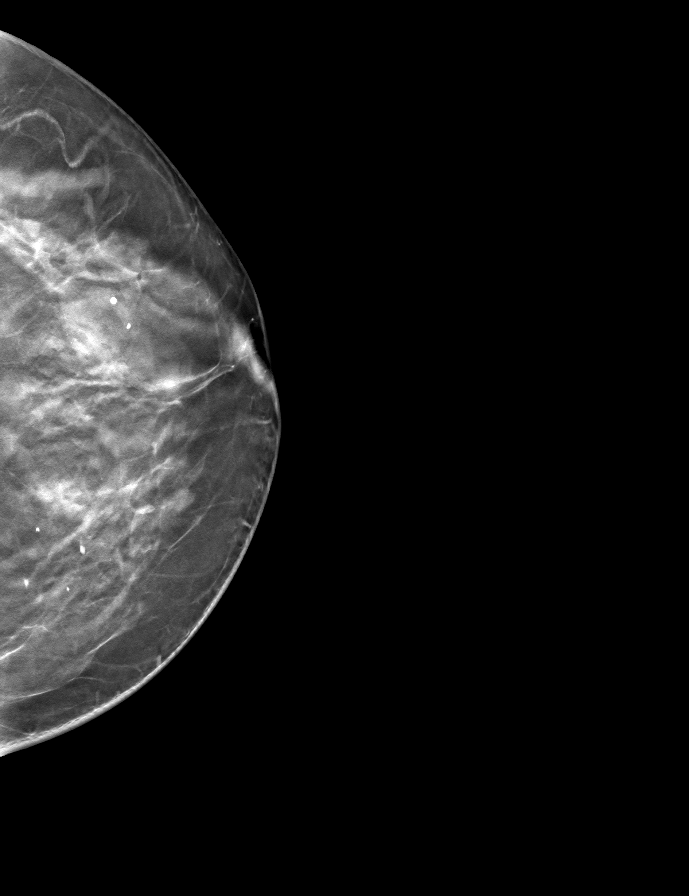

[9 of 24 positions shown; findings below may reference images not displayed]

ACR Breast Density Category c: The breast tissue is heterogeneously
dense, which may obscure small masses.
FINDINGS: In the left breast, a possible mass warrants further evaluation. In
the right breast, no findings suspicious for malignancy.
IMPRESSION: Further evaluation is suggested for a possible mass in the left
breast.

RECOMMENDATION:
Diagnostic mammogram and possibly ultrasound of the left breast.
(Code:C3-P-XXB)

The patient will be contacted regarding the findings, and additional
imaging will be scheduled.

BI-RADS CATEGORY  0: Incomplete. Need additional imaging evaluation
and/or prior mammograms for comparison.

## 2023-01-09 ENCOUNTER — Ambulatory Visit: Admission: RE | Admit: 2023-01-09 | Payer: Medicare Other | Source: Ambulatory Visit

## 2023-01-09 DIAGNOSIS — Z1231 Encounter for screening mammogram for malignant neoplasm of breast: Secondary | ICD-10-CM | POA: Diagnosis not present

## 2023-02-05 DIAGNOSIS — R7303 Prediabetes: Secondary | ICD-10-CM | POA: Diagnosis not present

## 2023-02-05 DIAGNOSIS — N1831 Chronic kidney disease, stage 3a: Secondary | ICD-10-CM | POA: Diagnosis not present

## 2023-02-05 DIAGNOSIS — M353 Polymyalgia rheumatica: Secondary | ICD-10-CM | POA: Diagnosis not present

## 2023-02-05 DIAGNOSIS — I10 Essential (primary) hypertension: Secondary | ICD-10-CM | POA: Diagnosis not present

## 2023-04-07 DIAGNOSIS — H52203 Unspecified astigmatism, bilateral: Secondary | ICD-10-CM | POA: Diagnosis not present

## 2023-04-07 DIAGNOSIS — H04123 Dry eye syndrome of bilateral lacrimal glands: Secondary | ICD-10-CM | POA: Diagnosis not present

## 2023-04-07 DIAGNOSIS — H43813 Vitreous degeneration, bilateral: Secondary | ICD-10-CM | POA: Diagnosis not present

## 2023-09-03 DIAGNOSIS — N1831 Chronic kidney disease, stage 3a: Secondary | ICD-10-CM | POA: Diagnosis not present

## 2023-09-03 DIAGNOSIS — E7800 Pure hypercholesterolemia, unspecified: Secondary | ICD-10-CM | POA: Diagnosis not present

## 2023-09-03 DIAGNOSIS — I10 Essential (primary) hypertension: Secondary | ICD-10-CM | POA: Diagnosis not present

## 2023-09-03 DIAGNOSIS — R7303 Prediabetes: Secondary | ICD-10-CM | POA: Diagnosis not present

## 2023-09-03 DIAGNOSIS — Z23 Encounter for immunization: Secondary | ICD-10-CM | POA: Diagnosis not present

## 2023-09-03 DIAGNOSIS — Z13820 Encounter for screening for osteoporosis: Secondary | ICD-10-CM | POA: Diagnosis not present

## 2023-09-03 DIAGNOSIS — Z1231 Encounter for screening mammogram for malignant neoplasm of breast: Secondary | ICD-10-CM | POA: Diagnosis not present

## 2023-09-03 DIAGNOSIS — Z0000 Encounter for general adult medical examination without abnormal findings: Secondary | ICD-10-CM | POA: Diagnosis not present

## 2023-09-07 ENCOUNTER — Encounter: Payer: Self-pay | Admitting: Family Medicine

## 2023-09-25 ENCOUNTER — Other Ambulatory Visit: Payer: Self-pay | Admitting: Family Medicine

## 2023-09-25 DIAGNOSIS — Z1231 Encounter for screening mammogram for malignant neoplasm of breast: Secondary | ICD-10-CM

## 2023-09-25 DIAGNOSIS — Z13820 Encounter for screening for osteoporosis: Secondary | ICD-10-CM

## 2023-10-17 DIAGNOSIS — I10 Essential (primary) hypertension: Secondary | ICD-10-CM | POA: Diagnosis not present

## 2023-10-17 DIAGNOSIS — N1831 Chronic kidney disease, stage 3a: Secondary | ICD-10-CM | POA: Diagnosis not present

## 2023-10-17 DIAGNOSIS — E7800 Pure hypercholesterolemia, unspecified: Secondary | ICD-10-CM | POA: Diagnosis not present

## 2023-12-17 DIAGNOSIS — I10 Essential (primary) hypertension: Secondary | ICD-10-CM | POA: Diagnosis not present

## 2023-12-17 DIAGNOSIS — E7800 Pure hypercholesterolemia, unspecified: Secondary | ICD-10-CM | POA: Diagnosis not present

## 2023-12-17 DIAGNOSIS — N1831 Chronic kidney disease, stage 3a: Secondary | ICD-10-CM | POA: Diagnosis not present

## 2024-01-12 ENCOUNTER — Other Ambulatory Visit (HOSPITAL_BASED_OUTPATIENT_CLINIC_OR_DEPARTMENT_OTHER)

## 2024-01-12 ENCOUNTER — Ambulatory Visit (HOSPITAL_BASED_OUTPATIENT_CLINIC_OR_DEPARTMENT_OTHER)
Admission: RE | Admit: 2024-01-12 | Discharge: 2024-01-12 | Disposition: A | Discharge status: Home / Self Care | Source: Ambulatory Visit | Attending: Family Medicine | Admitting: Family Medicine

## 2024-01-12 ENCOUNTER — Encounter (HOSPITAL_BASED_OUTPATIENT_CLINIC_OR_DEPARTMENT_OTHER): Payer: Self-pay | Admitting: Radiology

## 2024-01-12 DIAGNOSIS — Z1231 Encounter for screening mammogram for malignant neoplasm of breast: Secondary | ICD-10-CM | POA: Insufficient documentation

## 2024-01-17 DIAGNOSIS — E7800 Pure hypercholesterolemia, unspecified: Secondary | ICD-10-CM | POA: Diagnosis not present

## 2024-01-17 DIAGNOSIS — I10 Essential (primary) hypertension: Secondary | ICD-10-CM | POA: Diagnosis not present

## 2024-01-17 DIAGNOSIS — N1831 Chronic kidney disease, stage 3a: Secondary | ICD-10-CM | POA: Diagnosis not present

## 2024-01-19 ENCOUNTER — Ambulatory Visit (HOSPITAL_BASED_OUTPATIENT_CLINIC_OR_DEPARTMENT_OTHER)
Admission: RE | Admit: 2024-01-19 | Discharge: 2024-01-19 | Disposition: A | Discharge status: Home / Self Care | Source: Ambulatory Visit | Attending: Family Medicine | Admitting: Family Medicine

## 2024-01-19 DIAGNOSIS — Z13820 Encounter for screening for osteoporosis: Secondary | ICD-10-CM | POA: Insufficient documentation

## 2024-01-19 DIAGNOSIS — Z780 Asymptomatic menopausal state: Secondary | ICD-10-CM | POA: Diagnosis not present

## 2024-01-19 DIAGNOSIS — M85851 Other specified disorders of bone density and structure, right thigh: Secondary | ICD-10-CM | POA: Insufficient documentation

## 2024-02-16 DIAGNOSIS — E7800 Pure hypercholesterolemia, unspecified: Secondary | ICD-10-CM | POA: Diagnosis not present

## 2024-02-16 DIAGNOSIS — I10 Essential (primary) hypertension: Secondary | ICD-10-CM | POA: Diagnosis not present

## 2024-02-16 DIAGNOSIS — N1831 Chronic kidney disease, stage 3a: Secondary | ICD-10-CM | POA: Diagnosis not present

## 2024-03-03 DIAGNOSIS — E7800 Pure hypercholesterolemia, unspecified: Secondary | ICD-10-CM | POA: Diagnosis not present

## 2024-03-03 DIAGNOSIS — I349 Nonrheumatic mitral valve disorder, unspecified: Secondary | ICD-10-CM | POA: Diagnosis not present

## 2024-03-03 DIAGNOSIS — R7303 Prediabetes: Secondary | ICD-10-CM | POA: Diagnosis not present

## 2024-03-03 DIAGNOSIS — I10 Essential (primary) hypertension: Secondary | ICD-10-CM | POA: Diagnosis not present

## 2024-03-03 DIAGNOSIS — K5909 Other constipation: Secondary | ICD-10-CM | POA: Diagnosis not present

## 2024-06-15 ENCOUNTER — Other Ambulatory Visit: Payer: Self-pay

## 2024-06-15 ENCOUNTER — Emergency Department (HOSPITAL_COMMUNITY)
Admission: EM | Admit: 2024-06-15 | Discharge: 2024-06-16 | Disposition: A | Discharge status: Home / Self Care | Attending: Emergency Medicine | Admitting: Emergency Medicine

## 2024-06-15 DIAGNOSIS — M353 Polymyalgia rheumatica: Secondary | ICD-10-CM | POA: Diagnosis not present

## 2024-06-15 DIAGNOSIS — Z79899 Other long term (current) drug therapy: Secondary | ICD-10-CM | POA: Diagnosis not present

## 2024-06-15 DIAGNOSIS — K625 Hemorrhage of anus and rectum: Secondary | ICD-10-CM | POA: Diagnosis present

## 2024-06-15 DIAGNOSIS — K921 Melena: Secondary | ICD-10-CM | POA: Diagnosis not present

## 2024-06-15 DIAGNOSIS — K644 Residual hemorrhoidal skin tags: Secondary | ICD-10-CM | POA: Diagnosis not present

## 2024-06-15 DIAGNOSIS — I700 Atherosclerosis of aorta: Secondary | ICD-10-CM | POA: Insufficient documentation

## 2024-06-15 DIAGNOSIS — E785 Hyperlipidemia, unspecified: Secondary | ICD-10-CM | POA: Diagnosis not present

## 2024-06-15 DIAGNOSIS — K5900 Constipation, unspecified: Secondary | ICD-10-CM | POA: Diagnosis not present

## 2024-06-15 DIAGNOSIS — N838 Other noninflammatory disorders of ovary, fallopian tube and broad ligament: Secondary | ICD-10-CM | POA: Diagnosis not present

## 2024-06-15 DIAGNOSIS — K8020 Calculus of gallbladder without cholecystitis without obstruction: Secondary | ICD-10-CM | POA: Diagnosis not present

## 2024-06-15 DIAGNOSIS — I10 Essential (primary) hypertension: Secondary | ICD-10-CM | POA: Insufficient documentation

## 2024-06-15 DIAGNOSIS — Z7982 Long term (current) use of aspirin: Secondary | ICD-10-CM | POA: Insufficient documentation

## 2024-06-15 DIAGNOSIS — R1030 Lower abdominal pain, unspecified: Secondary | ICD-10-CM | POA: Diagnosis present

## 2024-06-15 LAB — CBC WITH DIFFERENTIAL/PLATELET
Abs Immature Granulocytes: 0.02 10*3/uL (ref 0.00–0.07)
Basophils Absolute: 0 10*3/uL (ref 0.0–0.1)
Basophils Relative: 1 %
Eosinophils Absolute: 0.2 10*3/uL (ref 0.0–0.5)
Eosinophils Relative: 3 %
HCT: 35 % — ABNORMAL LOW (ref 36.0–46.0)
Hemoglobin: 11.5 g/dL — ABNORMAL LOW (ref 12.0–15.0)
Immature Granulocytes: 0 %
Lymphocytes Relative: 34 %
Lymphs Abs: 2.1 10*3/uL (ref 0.7–4.0)
MCH: 29 pg (ref 26.0–34.0)
MCHC: 32.9 g/dL (ref 30.0–36.0)
MCV: 88.2 fL (ref 80.0–100.0)
Monocytes Absolute: 0.6 10*3/uL (ref 0.1–1.0)
Monocytes Relative: 9 %
Neutro Abs: 3.4 10*3/uL (ref 1.7–7.7)
Neutrophils Relative %: 53 %
Platelets: 348 10*3/uL (ref 150–400)
RBC: 3.97 MIL/uL (ref 3.87–5.11)
RDW: 12.7 % (ref 11.5–15.5)
WBC: 6.3 10*3/uL (ref 4.0–10.5)
nRBC: 0 % (ref 0.0–0.2)

## 2024-06-15 LAB — COMPREHENSIVE METABOLIC PANEL WITH GFR
ALT: 12 U/L (ref 0–44)
AST: 20 U/L (ref 15–41)
Albumin: 4.3 g/dL (ref 3.5–5.0)
Alkaline Phosphatase: 57 U/L (ref 38–126)
Anion gap: 11 (ref 5–15)
BUN: 23 mg/dL (ref 8–23)
CO2: 26 mmol/L (ref 22–32)
Calcium: 9.7 mg/dL (ref 8.9–10.3)
Chloride: 103 mmol/L (ref 98–111)
Creatinine, Ser: 1.28 mg/dL — ABNORMAL HIGH (ref 0.44–1.00)
GFR, Estimated: 43 mL/min — ABNORMAL LOW
Glucose, Bld: 96 mg/dL (ref 70–99)
Potassium: 4.7 mmol/L (ref 3.5–5.1)
Sodium: 140 mmol/L (ref 135–145)
Total Bilirubin: 0.3 mg/dL (ref 0.0–1.2)
Total Protein: 7.5 g/dL (ref 6.5–8.1)

## 2024-06-15 LAB — TYPE AND SCREEN
ABO/RH(D): O POS
Antibody Screen: NEGATIVE

## 2024-06-15 NOTE — ED Triage Notes (Signed)
 Pt arrives POV with complaints of rectal bleeding that started yesterday as streaky but today has had about 3 episodes of dark blood in her stool today. States she has had diarrhea for 4 months and is seeing GI about it but they recommended she come to ED for a workup due to bleeding. Complains of lower back and rectum pain.

## 2024-06-15 NOTE — ED Provider Triage Note (Signed)
 Emergency Medicine Provider Triage Evaluation Note  Meredith Leach , a 79 y.o. female  was evaluated in triage.  Pt complains of rectal bleeding.  Patient reports noticing maroon-colored stool in the last several days.  States that she does take aspirin and ibuprofen but is not on any other blood thinner.  She denies any feelings of dizziness or lightheadedness.  Review of Systems  Positive: As above Negative: As above  Physical Exam  BP 122/63   Pulse 74   Temp 97.9 F (36.6 C)   Resp 16   Ht 5' 3 (1.6 m)   Wt 59.4 kg   SpO2 97%   BMI 23.21 kg/m  Gen:   Awake, no distress   Resp:  Normal effort MSK:   Moves extremities without difficulty  Other:  No focal abdominal tenderness.  Medical Decision Making  Medically screening exam initiated at 4:39 PM.  Appropriate orders placed.  Meredith Leach was informed that the remainder of the evaluation will be completed by another provider, this initial triage assessment does not replace that evaluation, and the importance of remaining in the ED until their evaluation is complete.     Meredith Leach A, PA-C 06/15/24 1640

## 2024-06-16 ENCOUNTER — Emergency Department (HOSPITAL_COMMUNITY)

## 2024-06-16 LAB — PROTIME-INR
INR: 1 (ref 0.8–1.2)
Prothrombin Time: 13.9 s (ref 11.4–15.2)

## 2024-06-16 LAB — HEMOGLOBIN AND HEMATOCRIT, BLOOD
HCT: 35.7 % — ABNORMAL LOW (ref 36.0–46.0)
Hemoglobin: 11.6 g/dL — ABNORMAL LOW (ref 12.0–15.0)

## 2024-06-16 LAB — POC OCCULT BLOOD, ED: Fecal Occult Bld: POSITIVE — AB

## 2024-06-16 MED ORDER — IOHEXOL 350 MG/ML SOLN
75.0000 mL | Freq: Once | INTRAVENOUS | Status: AC | PRN
  Administered 2024-06-16: 75 mL via INTRAVENOUS

## 2024-06-16 MED ORDER — POLYETHYLENE GLYCOL 3350 17 G PO PACK
17.0000 g | PACK | Freq: Once | ORAL | Status: AC
  Administered 2024-06-16: 17 g via ORAL
  Filled 2024-06-16: qty 1

## 2024-06-16 MED ORDER — SODIUM CHLORIDE 0.9 % IV BOLUS
500.0000 mL | Freq: Once | INTRAVENOUS | Status: AC
  Administered 2024-06-16: 500 mL via INTRAVENOUS

## 2024-06-16 MED ORDER — MORPHINE SULFATE (PF) 2 MG/ML IV SOLN
4.0000 mg | Freq: Once | INTRAVENOUS | Status: AC
  Administered 2024-06-16: 4 mg via INTRAVENOUS
  Filled 2024-06-16: qty 2

## 2024-06-16 MED ORDER — ONDANSETRON HCL 4 MG/2ML IJ SOLN
4.0000 mg | Freq: Once | INTRAMUSCULAR | Status: AC
  Administered 2024-06-16: 4 mg via INTRAVENOUS
  Filled 2024-06-16: qty 2

## 2024-06-16 MED ORDER — HYDROCORTISONE ACETATE 25 MG RE SUPP: 25.0000 mg | Freq: Two times a day (BID) | RECTAL | 0 refills | Status: AC | PRN

## 2024-06-16 MED ORDER — FLEET ENEMA RE ENEM
1.0000 | ENEMA | Freq: Once | RECTAL | Status: AC
  Administered 2024-06-16: 1 via RECTAL
  Filled 2024-06-16: qty 1

## 2024-06-16 NOTE — ED Notes (Signed)
 Orthostatics performed. No complaints of light-headedness while conducting orthostatics. Will notify RN and EDP.

## 2024-06-16 NOTE — ED Provider Notes (Signed)
 " [place] EMERGENCY DEPARTMENT AT [facility] Provider Note   CSN: [id number] Arrival date & time: 06/15/24  1604     Patient presents with: Rectal Bleeding   Meredith Leach is a 79 y.o. female.   Pt is a 79 yo female with pmhx significant for HTN, HLD, and polymyalgia rheumatica.  Pt presents to the ED today with lower abd pain and rectal bleeding.  Bleeding started on Tuesday (1/27).  Pt has been taking tylenol for her pain.  She denies n/v.  No f/c.  No blood thinner.       Prior to Admission medications  Medication Sig Start Date End Date Taking? Authorizing Provider  hydrocortisone  (ANUSOL -HC) 25 MG suppository Place 1 suppository (25 mg total) rectally 2 (two) times daily as needed (rectal bleeding). 06/16/24  Yes Jahmari Esbenshade, MD  amLODipine (NORVASC) 5 MG tablet Take 1 tablet by mouth 2 (two) times daily.    [provider]  aspirin 81 MG tablet Take 81 mg by mouth daily.    [provider]  calcium-vitamin D (OSCAL WITH D) 500-200 MG-UNIT per tablet Take 1 tablet by mouth daily with breakfast. Patient not taking: Reported on 12/30/2021    [provider]  CRESTOR 20 MG tablet Take 10 mg by mouth daily. 04/17/14   [provider]  estradiol (ESTRACE) 1 MG tablet Take 1 tablet by mouth daily.    [provider]  famotidine (PEPCID) 20 MG tablet Take 20 mg by mouth as needed for heartburn or indigestion.    [provider]  hydrochlorothiazide (HYDRODIURIL) 12.5 MG tablet 1 tablet in the morning 11/28/20   [provider]  losartan (COZAAR) 100 MG tablet Take 100 mg by mouth daily. 08/11/21   [provider]  Omega-3 Fatty Acids (FISH OIL) 1200 MG CAPS 1 capsule    [provider]  predniSONE  (DELTASONE ) 5 MG tablet TAKE 1&1/2 TABS BY MOUTH DAILY WITH BREAKFAST X7 DAYS, THEN 1 TAB X7 DAYS, THEN 1/2 TAB X7 DAYS Patient not taking: Reported on 12/30/2021 11/11/21   Jeannetta Lonni ORN, MD   rosuvastatin (CRESTOR) 10 MG tablet Take 10 mg by mouth daily. 09/23/21   [provider]    Allergies: Hydrocodone-acetaminophen    Review of Systems  Gastrointestinal:  Positive for abdominal pain and blood in stool.  All other systems reviewed and are negative.   Updated Vital Signs BP (!) 163/72 (BP Location: Right Arm)   Pulse 71   Temp 98.3 F (36.8 C) (Oral)   Resp 16   Ht 5' 3 (1.6 m)   Wt 59.4 kg   SpO2 100%   BMI 23.21 kg/m   Physical Exam Vitals and nursing note reviewed.  Constitutional:      Appearance: Normal appearance.  HENT:     Head: Normocephalic and atraumatic.     Right Ear: External ear normal.     Left Ear: External ear normal.     Nose: Nose normal.     Mouth/Throat:     Mouth: Mucous membranes are moist.     Pharynx: Oropharynx is clear.  Eyes:     Extraocular Movements: Extraocular movements intact.     Conjunctiva/sclera: Conjunctivae normal.     Pupils: Pupils are equal, round, and reactive to light.  Cardiovascular:     Rate and Rhythm: Normal rate and regular rhythm.     Pulses: Normal pulses.     Heart sounds: Normal heart sounds.  Pulmonary:  Effort: Pulmonary effort is normal.     Breath sounds: Normal breath sounds.  Abdominal:     General: Abdomen is flat. Bowel sounds are normal.     Palpations: Abdomen is soft.  Genitourinary:    Rectum: Guaiac result positive. External hemorrhoid present.     Comments: Brown stool; no fecal impaction Musculoskeletal:        General: Normal range of motion.     Cervical back: Normal range of motion and neck supple.  Skin:    General: Skin is warm.     Capillary Refill: Capillary refill takes less than 2 seconds.  Neurological:     General: No focal deficit present.     Mental Status: She is alert and oriented to person, place, and time.  Psychiatric:        Mood and Affect: Mood normal.        Behavior: Behavior normal.     (all labs ordered are listed, but only  abnormal results are displayed) Labs Reviewed  CBC WITH DIFFERENTIAL/PLATELET - Abnormal; Notable for the following components:      Result Value   Hemoglobin 11.5 (*)    HCT 35.0 (*)    All other components within normal limits  COMPREHENSIVE METABOLIC PANEL WITH GFR - Abnormal; Notable for the following components:   Creatinine, Ser 1.28 (*)    GFR, Estimated 43 (*)    All other components within normal limits  HEMOGLOBIN AND HEMATOCRIT, BLOOD - Abnormal; Notable for the following components:   Hemoglobin 11.6 (*)    HCT 35.7 (*)    All other components within normal limits  POC OCCULT BLOOD, ED - Abnormal; Notable for the following components:   Fecal Occult Bld POSITIVE (*)    All other components within normal limits  PROTIME-INR  CEA  CA 125  TYPE AND SCREEN    EKG: None  Radiology: US  PELVIS (TRANSABDOMINAL ONLY) Result Date: 06/16/2024 EXAM: PELVIC ULTRASOUND 06/16/2024 03:06:44 PM TECHNIQUE: Transabdominal pelvic duplex ultrasound using B-mode/gray scaled imaging with Doppler spectral analysis and color flow was obtained. COMPARISON: None available. CLINICAL HISTORY: Ovarian cystic mass. FINDINGS: ULTRASOUND FINDINGS: UTERUS: Hysterectomy. ENDOMETRIAL STRIPE: Not applicable due to hysterectomy. RIGHT OVARY: Right ovary measures 9.2 x 8.8 x 9.8 cm. Large multiseptated cyst filling the right ovary, measuring 9.2 x 8.8 x 9.8 cm. Septal thickening and soft tissue is appreciated with areas of vascular Doppler flow noted. LEFT OVARY: The left ovary was not visualized, likely due to age related atrophy and overlying bowel gas. FREE FLUID: No free fluid. IMPRESSION: 1. 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 Gynecologic oncology consultation recommended. A nonemergent MRI pelvis with IV contrast may also be of benefit for preoperative characterization. Electronically signed by: Rogelia Myers MD 06/16/2024 03:46 PM EST RP Workstation: [id number]   CT Angio Abd/Pel W and/or Wo Contrast Result Date: 06/16/2024 CLINICAL DATA:  Lower GI bleed. EXAM: CT ANGIOGRAPHY ABDOMEN AND PELVIS WITH CONTRAST AND WITHOUT CONTRAST TECHNIQUE: Multidetector CT imaging of the abdomen and pelvis was performed using the standard protocol during bolus administration of intravenous contrast. Multiplanar reconstructed images and MIPs were obtained and reviewed to evaluate the vascular anatomy. RADIATION DOSE REDUCTION: This exam was performed according to the departmental dose-optimization program which includes automated exposure control, adjustment of the mA and/or kV according to patient size and/or use of iterative reconstruction technique. CONTRAST:  75mL OMNIPAQUE  IOHEXOL  350 MG/ML SOLN COMPARISON:  None Available. FINDINGS: VASCULAR Aorta: Atherosclerosis of the abdominal aorta without evidence of aneurysm or dissection. Celiac: Normally patent. The celiac axis supplies the left gastric and splenic arteries. SMA: Normally patent. Normal variant replaced common hepatic artery off of the SMA. Renals: Normally patent bilateral single renal arteries. IMA: Normally patent. Inflow: Atherosclerosis of bilateral common and internal iliac arteries without significant obstructive disease or aneurysmal disease. Normally patent bilateral external iliac arteries. Proximal Outflow: Normally patent bilateral common femoral arteries and femoral bifurcations. Veins: Venous phase imaging demonstrates normally patent venous structures in the abdomen and pelvis. Review of the MIP images confirms the above findings. NON-VASCULAR Lower chest: No acute abnormality. Hepatobiliary: 10 mm probable benign cyst in the anterior subcapsular right lobe of the liver. Calcified gallstones in the gallbladder without gallbladder distension or inflammation. No biliary ductal dilatation. Pancreas: Unremarkable. No pancreatic ductal dilatation or  surrounding inflammatory changes. Spleen: Normal in size without focal abnormality. Adrenals/Urinary Tract: Adrenal glands are unremarkable. Kidneys are normal, without renal calculi, focal lesion, or hydronephrosis. Bladder is unremarkable. Stomach/Bowel: No evidence of active gastrointestinal bleeding on arterial or venous phases of imaging. Moderate fecal material throughout the stool. No bowel obstruction, ileus or free intraperitoneal air. No visualized bowel masses by CT. The appendix is normal. Lymphatic: No enlarged abdominal or pelvic lymph nodes. Reproductive: Large complex cystic mass of the right pelvis likely is of adnexal/ovarian origin and demonstrates irregular, thick internal septations. Maximum mass dimensions are approximately 10.7 x 7.9 x 9.3 cm. 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 The uterus has been removed. Discrete left-sided ovarian tissue is not clearly delineated by CT. Other: No abdominal wall hernia or abnormality. No abdominopelvic ascites. Musculoskeletal: No acute or significant osseous findings. IMPRESSION: 1. No evidence of active gastrointestinal bleeding on arterial or venous phases of imaging. 2. Moderate fecal material throughout the stool without visualized bowel lesion. 3. Large complex cystic mass of the right pelvis likely is of adnexal/ovarian origin and demonstrates irregular, thick internal septations. Maximum mass dimensions are approximately 10.7 x 7.9 x 9.3 cm. 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 Initial characterization with pelvic ultrasound recommended. Recommend Gynecologic Oncology consultation. 4. Cholelithiasis without evidence of acute cholecystitis. 5. Aortic atherosclerosis without evidence of aneurysm or dissection. Electronically Signed   By: Marcey Moan M.D.   On: 06/16/2024 12:57     Procedures   Medications Ordered in the ED  morphine  (PF) 2 MG/ML injection 4 mg (4 mg Intravenous Given 06/16/24 1115)  ondansetron  (ZOFRAN )  injection 4 mg (4 mg Intravenous Given 06/16/24 1115)  sodium chloride  0.9 % bolus 500 mL (500 mLs Intravenous New Bag/Given 06/16/24 1115)  iohexol  (OMNIPAQUE ) 350 MG/ML injection 75 mL (75 mLs Intravenous Contrast Given 06/16/24 1230)  sodium phosphate (FLEET) enema 1 enema (1 enema Rectal Given 06/16/24 1515)  polyethylene glycol (MIRALAX  / GLYCOLAX ) packet 17 g (17 g Oral Given 06/16/24 1514)                                    Medical Decision Making Amount and/or Complexity of Data Reviewed Labs: ordered. Radiology: ordered.  Risk OTC drugs. Prescription drug management.   This patient presents to the ED for concern of rectal bleeding, this involves an extensive number of treatment options, and is a complaint that carries with it a high risk of complications and morbidity.  The differential diagnosis includes diverticulitis, colitis, diverticular bleeding  Co morbidities that complicate  the patient evaluation  HTN, HLD, and polymyalgia rheumatica.   Additional history obtained:  Additional history obtained from epic chart review  Lab Tests:  I Ordered, and personally interpreted labs.  The pertinent results include:  cbc with hgb 11.5 (hgb 12.5 on 08/1723), but repeat stable at 11.6 20 hrs later; cmp nl other than cr 1.28 (0.83 on 09/03/23)   Imaging Studies ordered:  I ordered imaging studies including cta abd/pelvis and US  I independently visualized and interpreted imaging which showed  CT abd/pelvis: 1. No evidence of active gastrointestinal bleeding on arterial or  venous phases of imaging.  2. Moderate fecal material throughout the stool without visualized  bowel lesion.  3. Large complex cystic mass of the right pelvis likely is of  adnexal/ovarian origin and demonstrates irregular, thick internal  septations. Maximum mass dimensions are approximately 10.7 x 7.9 x  9.3 cm. 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 Initial  characterization with pelvic  ultrasound recommended. Recommend  Gynecologic Oncology consultation.  4. Cholelithiasis without evidence of acute cholecystitis.  5. Aortic atherosclerosis without evidence of aneurysm or  dissection.  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 Gynecologic  oncology consultation recommended. A nonemergent MRI pelvis with IV contrast  may also be of benefit for preoperative characterization.   I agree with the radiologist interpretation  Medicines ordered and prescription drug management:  I ordered medication including ivfs/morphine /zofran   for sx  Reevaluation of the patient after these medicines showed that the patient improved I have reviewed the patients home medicines and have made adjustments as needed   Test Considered:  ct   Consultations Obtained:  I requested consultation with the gastroenterologist (Dr. Elicia) ,  and discussed lab and imaging findings as well as pertinent plan - as hgb is stable and no bleed on ct, she can go home with outpatient f/u Pt d/w Eleanor Epps, NP for gyn-onc who recommended US  and cea and ca-125.  She made a f/u appt on 2/3.   Problem List / ED Course:  Constipation:  small bowel mvmt with enema.  Pt is to continue her linzess and eat a high fiber diet. Rectal bleeding:  likely due to constipation and internal hemorrhoid.  She has no bleeding on cta and has stable hemoglobins Ovarian mass:  concern for ovarian cancer.  Pt to f/u with gyn-onc as scheduled. Abd pain:  pt will take tylenol for the pain.  She does not want to make constipation worse with narcotics.   Reevaluation:  After the interventions noted above, I reevaluated the patient and found that they have :improved   Social Determinants of Health:  Lives at home   Dispostion:  After consideration of the diagnostic results and the patients response to treatment, I  feel that the patent would benefit from discharge with outpatient f/u     Final diagnoses:  Ovarian mass, right  Constipation, unspecified constipation type  Hematochezia    ED Discharge Orders          Ordered    hydrocortisone  (ANUSOL -HC) 25 MG suppository  2 times daily PRN        06/16/24 1641               Dean Clarity, MD 06/16/24 1647  "

## 2024-06-16 NOTE — Consult Note (Signed)
 Referring Provider: TH Primary Care Physician:  Claudene Pellet, MD Primary Gastroenterologist: [facility]  Reason for Consultation: GI bleed  HPI: Meredith Leach is a 79 y.o. female with past medical history of polymyalgia rheumatica and Bell's palsy, history of chronic constipation who was seen in the GI office June 01, 2024 with worsening constipation.  She has previously failed MiraLAX  and Amitiza.  She was given trial of Linzess 145 mcg.  She called our office yesterday stating that she has been having rectal bleeding for 2 days and was advised to go to the emergency room for further evaluation.  Initial evaluation showed mild anemia with hemoglobin of 11.5, creatinine 1.28 otherwise normal CBC and CMP.  CT angio abdomen pelvis did not showed any evidence of active bleeding but did showed finding concerning for large right ovarian mass of around 10 cm.  Patient seen and examined at bedside.  Last episode of rectal bleeding this morning.  Complaining of generalized abdominal discomfort.  Last colonoscopy in July 2019 showed internal hemorrhoids and repeat was recommended in 10 years. Past Medical History:  Diagnosis Date   Bell's palsy    History of colon polyps    Hyperlipidemia    PMR (polymyalgia rheumatica)     Past Surgical History:  Procedure Laterality Date   ABDOMINAL HYSTERECTOMY     BREAST BIOPSY Left 07/24/2021   benign   COLONOSCOPY     PARTIAL HYSTERECTOMY      Prior to Admission medications  Medication Sig Start Date End Date Taking? Authorizing Provider  amLODipine (NORVASC) 5 MG tablet Take 1 tablet by mouth 2 (two) times daily.    [provider]  aspirin 81 MG tablet Take 81 mg by mouth daily.    [provider]  calcium-vitamin D (OSCAL WITH D) 500-200 MG-UNIT per tablet Take 1 tablet by mouth daily with breakfast. Patient not taking: Reported on 12/30/2021    [provider]  CRESTOR 20 MG tablet Take 10 mg by mouth daily. 04/17/14    [provider]  estradiol (ESTRACE) 1 MG tablet Take 1 tablet by mouth daily.    [provider]  famotidine (PEPCID) 20 MG tablet Take 20 mg by mouth as needed for heartburn or indigestion.    [provider]  hydrochlorothiazide (HYDRODIURIL) 12.5 MG tablet 1 tablet in the morning 11/28/20   [provider]  losartan (COZAAR) 100 MG tablet Take 100 mg by mouth daily. 08/11/21   [provider]  Omega-3 Fatty Acids (FISH OIL) 1200 MG CAPS 1 capsule    [provider]  predniSONE  (DELTASONE ) 5 MG tablet TAKE 1&1/2 TABS BY MOUTH DAILY WITH BREAKFAST X7 DAYS, THEN 1 TAB X7 DAYS, THEN 1/2 TAB X7 DAYS Patient not taking: Reported on 12/30/2021 11/11/21   Jeannetta Lonni ORN, MD  rosuvastatin (CRESTOR) 10 MG tablet Take 10 mg by mouth daily. 09/23/21   [provider]    Scheduled Meds:  polyethylene glycol  17 g Oral Once   sodium phosphate  1 enema Rectal Once   Continuous Infusions: PRN Meds:.  Allergies as of 06/15/2024 - Review Complete 06/15/2024  Allergen Reaction Noted   Hydrocodone-acetaminophen  09/16/2021    Family History  Problem Relation Age of Onset   Alzheimer's disease Mother    CAD Mother    COPD Father    Diabetes Sister    Diabetes Brother    Breast cancer Neg Hx     Social History   Socioeconomic History  Marital status: Divorced    Spouse name: Not on file   Number of children: Not on file   Years of education: Not on file   Highest education level: Not on file  Occupational History   Not on file  Tobacco Use   Smoking status: Never    Passive exposure: Never   Smokeless tobacco: Never  Vaping Use   Vaping status: Never Used  Substance and Sexual Activity   Alcohol use: No   Drug use: No   Sexual activity: Not on file  Other Topics Concern   Not on file  Social History Narrative   Not on file   Social Drivers of Health   Tobacco Use: Low Risk (01/12/2024)   Patient History     Smoking Tobacco Use: Never    Smokeless Tobacco Use: Never    Passive Exposure: Never  Financial Resource Strain: Not on file  Food Insecurity: Not on file  Transportation Needs: Not on file  Physical Activity: Not on file  Stress: Not on file  Social Connections: Not on file  Intimate Partner Violence: Not on file  Depression (EYV7-0): Not on file  Alcohol Screen: Not on file  Housing: Not on file  Utilities: Not on file  Health Literacy: Not on file    Review of Systems: All negative except as stated above in HPI.  Physical Exam: Vital signs: Vitals:   06/16/24 1048 06/16/24 1337  BP: 134/63 (!) 163/72  Pulse: (!) 59 71  Resp: 18 16  Temp: 97.8 F (36.6 C) 98.3 F (36.8 C)  SpO2: 99% 100%     General:   Alert,  Well-developed, well-nourished, pleasant and cooperative in NAD Normocephalic, atraumatic Extraocular movement intact Lungs: No visible respiratory distress Heart:  Regular rate and rhythm; no murmurs, clicks, rubs,  or gallops. Abdomen: Right-sided abdominal discomfort, abdomen is otherwise nontender, abdomen is soft, bowel sound present, no peritoneal signs Mood and affect normal Alert and oriented x 3 Rectal:  Deferred  GI:  Lab Results: Recent Labs    06/15/24 1649 06/16/24 1106  WBC 6.3  --   HGB 11.5* 11.6*  HCT 35.0* 35.7*  PLT 348  --    BMET Recent Labs    06/15/24 1649  NA 140  K 4.7  CL 103  CO2 26  GLUCOSE 96  BUN 23  CREATININE 1.28*  CALCIUM 9.7   LFT Recent Labs    06/15/24 1649  PROT 7.5  ALBUMIN 4.3  AST 20  ALT 12  ALKPHOS 57  BILITOT 0.3   PT/INR Recent Labs    06/16/24 1106  LABPROT 13.9  INR 1.0     Studies/Results: CT Angio Abd/Pel W and/or Wo Contrast Result Date: 06/16/2024 CLINICAL DATA:  Lower GI bleed. EXAM: CT ANGIOGRAPHY ABDOMEN AND PELVIS WITH CONTRAST AND WITHOUT CONTRAST TECHNIQUE: Multidetector CT imaging of the abdomen and pelvis was performed using the standard protocol during bolus  administration of intravenous contrast. Multiplanar reconstructed images and MIPs were obtained and reviewed to evaluate the vascular anatomy. RADIATION DOSE REDUCTION: This exam was performed according to the departmental dose-optimization program which includes automated exposure control, adjustment of the mA and/or kV according to patient size and/or use of iterative reconstruction technique. CONTRAST:  75mL OMNIPAQUE  IOHEXOL  350 MG/ML SOLN COMPARISON:  None Available. FINDINGS: VASCULAR Aorta: Atherosclerosis of the abdominal aorta without evidence of aneurysm or dissection. Celiac: Normally patent. The celiac axis supplies the left gastric and splenic arteries. SMA: Normally patent. Normal variant  replaced common hepatic artery off of the SMA. Renals: Normally patent bilateral single renal arteries. IMA: Normally patent. Inflow: Atherosclerosis of bilateral common and internal iliac arteries without significant obstructive disease or aneurysmal disease. Normally patent bilateral external iliac arteries. Proximal Outflow: Normally patent bilateral common femoral arteries and femoral bifurcations. Veins: Venous phase imaging demonstrates normally patent venous structures in the abdomen and pelvis. Review of the MIP images confirms the above findings. NON-VASCULAR Lower chest: No acute abnormality. Hepatobiliary: 10 mm probable benign cyst in the anterior subcapsular right lobe of the liver. Calcified gallstones in the gallbladder without gallbladder distension or inflammation. No biliary ductal dilatation. Pancreas: Unremarkable. No pancreatic ductal dilatation or surrounding inflammatory changes. Spleen: Normal in size without focal abnormality. Adrenals/Urinary Tract: Adrenal glands are unremarkable. Kidneys are normal, without renal calculi, focal lesion, or hydronephrosis. Bladder is unremarkable. Stomach/Bowel: No evidence of active gastrointestinal bleeding on arterial or venous phases of imaging. Moderate  fecal material throughout the stool. No bowel obstruction, ileus or free intraperitoneal air. No visualized bowel masses by CT. The appendix is normal. Lymphatic: No enlarged abdominal or pelvic lymph nodes. Reproductive: Large complex cystic mass of the right pelvis likely is of adnexal/ovarian origin and demonstrates irregular, thick internal septations. Maximum mass dimensions are approximately 10.7 x 7.9 x 9.3 cm. 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 The uterus has been removed. Discrete left-sided ovarian tissue is not clearly delineated by CT. Other: No abdominal wall hernia or abnormality. No abdominopelvic ascites. Musculoskeletal: No acute or significant osseous findings. IMPRESSION: 1. No evidence of active gastrointestinal bleeding on arterial or venous phases of imaging. 2. Moderate fecal material throughout the stool without visualized bowel lesion. 3. Large complex cystic mass of the right pelvis likely is of adnexal/ovarian origin and demonstrates irregular, thick internal septations. Maximum mass dimensions are approximately 10.7 x 7.9 x 9.3 cm. 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 Initial characterization with pelvic ultrasound recommended. Recommend Gynecologic Oncology consultation. 4. Cholelithiasis without evidence of acute cholecystitis. 5. Aortic atherosclerosis without evidence of aneurysm or dissection. Electronically Signed   By: Marcey Moan M.D.   On: 06/16/2024 12:57    Impression/Plan: -Rectal bleeding in setting of constipation.  Likely hemorrhoidal bleeding.  CT angio negative for any active bleeding.  Hemoglobin stable at 11.5. - Abnormal CT scan concerning for 10 cm large  cystic mass in the right pelvis.  Concerning for ovarian neoplasm. - Chronic constipation  Recommendations --------------------------  - Awaiting ultrasound pelvis.  Awaiting tumor markers for ovarian cancer. -Depending on ultrasound findings, she will  need GYN evaluation. -Okay  to have diet from GI standpoint. - Monitor H&H.  GI will follow.     LOS: 0 days   Layla Lah  MD, FACP 06/16/2024, 2:16 PM  Contact #  [phone]

## 2024-06-16 NOTE — ED Notes (Signed)
 Reports maroon blood on Tuesday.  Complains of difficulty having BM just little pebbles.  Mild abd pain.  No thinners and hgb stable upon blood draw

## 2024-06-17 ENCOUNTER — Encounter: Payer: Self-pay | Admitting: Psychiatry

## 2024-06-17 LAB — CEA: CEA: 4.6 ng/mL (ref 0.0–4.7)

## 2024-06-17 LAB — CA 125: Cancer Antigen (CA) 125: 23.9 U/mL (ref 0.0–38.1)

## 2024-06-21 ENCOUNTER — Other Ambulatory Visit: Payer: Self-pay | Admitting: Gynecologic Oncology

## 2024-06-21 ENCOUNTER — Inpatient Hospital Stay: Admitting: Psychiatry

## 2024-06-21 ENCOUNTER — Inpatient Hospital Stay: Attending: Psychiatry

## 2024-06-21 ENCOUNTER — Telehealth: Payer: Self-pay | Admitting: Oncology

## 2024-06-21 ENCOUNTER — Encounter: Payer: Self-pay | Admitting: Psychiatry

## 2024-06-21 ENCOUNTER — Inpatient Hospital Stay: Admitting: Gynecologic Oncology

## 2024-06-21 VITALS — BP 133/67 | HR 61 | Temp 98.6°F | Resp 18 | Ht 63.0 in | Wt 132.0 lb

## 2024-06-21 DIAGNOSIS — M353 Polymyalgia rheumatica: Secondary | ICD-10-CM | POA: Insufficient documentation

## 2024-06-21 DIAGNOSIS — R6881 Early satiety: Secondary | ICD-10-CM | POA: Insufficient documentation

## 2024-06-21 DIAGNOSIS — R1900 Intra-abdominal and pelvic swelling, mass and lump, unspecified site: Secondary | ICD-10-CM | POA: Diagnosis not present

## 2024-06-21 DIAGNOSIS — K625 Hemorrhage of anus and rectum: Secondary | ICD-10-CM | POA: Insufficient documentation

## 2024-06-21 DIAGNOSIS — Z90710 Acquired absence of both cervix and uterus: Secondary | ICD-10-CM | POA: Insufficient documentation

## 2024-06-21 DIAGNOSIS — Z1273 Encounter for screening for malignant neoplasm of ovary: Secondary | ICD-10-CM | POA: Insufficient documentation

## 2024-06-21 DIAGNOSIS — K649 Unspecified hemorrhoids: Secondary | ICD-10-CM | POA: Insufficient documentation

## 2024-06-21 DIAGNOSIS — I10 Essential (primary) hypertension: Secondary | ICD-10-CM | POA: Insufficient documentation

## 2024-06-21 DIAGNOSIS — E785 Hyperlipidemia, unspecified: Secondary | ICD-10-CM | POA: Insufficient documentation

## 2024-06-21 DIAGNOSIS — Z8041 Family history of malignant neoplasm of ovary: Secondary | ICD-10-CM | POA: Insufficient documentation

## 2024-06-21 DIAGNOSIS — R970 Elevated carcinoembryonic antigen [CEA]: Secondary | ICD-10-CM | POA: Insufficient documentation

## 2024-06-21 DIAGNOSIS — Z803 Family history of malignant neoplasm of breast: Secondary | ICD-10-CM | POA: Insufficient documentation

## 2024-06-21 DIAGNOSIS — K5900 Constipation, unspecified: Secondary | ICD-10-CM | POA: Insufficient documentation

## 2024-06-21 NOTE — Progress Notes (Signed)
 GYNECOLOGIC ONCOLOGY NEW PATIENT CONSULTATION  Date of Service: 06/21/2024 Referring Provider: Mliss Boyers, MD (Emergency Medicine)   ASSESSMENT AND PLAN: Meredith Leach is a 79 y.o. woman with a complex right adnexal mass, normal CA125, mildly elevated CEA, chronic constipation.  Reviewed patient's symptom course to date.  Her primary initial symptom was several months of constipation for which she was evaluated by GI and started on Linzess.  Her rectal bleeding started subsequent to this and was felt to be possibly due to her constipation and hemorrhoids.  Reviewed her imaging findings that the adnexal mass does look abnormal in appearance.  However her CA125 is normal and her CEA is mildly elevated for a non-smoking individual.  Recommend additionally obtaining a CA 19-9 today.  Reviewed that the pelvic mass could be benign, borderline, or malignant process.  Reviewed in the setting of malignancy the 2 scenarios are primary ovarian malignancy or malignancy from another site that has metastasized to the ovary.  Reviewed her pelvic exam findings today with a generally soft and somewhat mobile pelvic mass that is completely separate from the rectum and does not appear to be contributing to external compression on her rectum that could explain her significant constipation.  However, on her exam I do have some concerns for possible rectal mass versus impacted stool in the proximal rectum.  Did have bright red blood on the tip of the gloved finger which does suggest possibly the blood is coming from more proximal and not hemorrhoids.  Given her exam findings along with her normal CA125 and her mildly elevated CEA, feel that it is critically important to rule out a primary colon malignancy prior to proceeding with removal of her pelvic mass.  Will urgently refer patient back to her GI provider for consideration of colonoscopy.  Given her large stool burden, recommend that she start MiraLAX  in addition  to her Linzess to potentially help clear out her colon prior to additional evaluation.  Additionally discussed holding surgery in 07/12/2024 for removal of both tubes and ovaries in case her GI workup is negative.  Patient was consented for: Robotic assisted bilateral salpingo-oophorectomy, possible tumor debulking or staging, possible laparotomy on 07/12/2024 pending GI workup as above.  Reviewed that if GI workup is concerning for colon primary, will hold off on surgery for ovarian mass.  In the event of ovarian malignancy or borderline tumor on frozen section, we will perform indicated staging procedures. We discussed that these procedures may include omentectomy pelvic and/or para-aortic lymphadenectomy, peritoneal biopsies. We would also remove any tissue concerning for metastatic disease which could require additional procedures including bowel surgery.  The risks of surgery were discussed in detail and she understands these to including but not limited to bleeding requiring a blood transfusion, infection, injury to adjacent organs (including but not limited to the bowels, bladder, ureters, nerves, blood vessels), thromboembolic events, wound separation, hernia, vaginal cuff separation, possible risk of lymphedema and lymphocyst if lymphadenectomy performed, unforseen complication, possible need for re-exploration, and possible permanent ostomy.  If the patient experiences any of these events, she understands that her hospitalization or recovery may be prolonged and that she may need to take additional medications for a prolonged period. The patient will receive DVT and antibiotic prophylaxis as indicated. She voiced a clear understanding. She had the opportunity to ask questions and informed consent was obtained today. She wishes to proceed.  She will proceed to the lab today for CA 19-9. She does not require preoperative clearance. Her  METs are >4.  All preoperative instructions were reviewed.  Postoperative expectations were also reviewed. Written handouts were provided to the patient.   A copy of this note was sent to the patient's referring provider.  Hoy Masters, MD Gynecologic Oncology   Medical Decision Making I personally spent  TOTAL 65 minutes face-to-face and non-face-to-face in the care of this patient, which includes all pre, intra, and post visit time on the date of service.   ------------  CC: pelvic mass  HISTORY OF PRESENT ILLNESS:  Meredith Leach is a 80 y.o. woman who is seen in consultation at the request of Mliss Boyers, MD for evaluation of pelvic mass.  Patient presented to the [facility] emergency department on 06/16/2024 for report of lower abdominal pain and rectal bleeding that started on 06/14/2024.  A rectal exam was performed with external hemorrhoids present and guaiac positive.  Stool was noted to be brown with no fecal impaction.  No pelvic exam was performed.  A CT angio abdomen/pelvis was formed which noted an incidental 10.7 x 7.9 x 9.3 cm complex cystic mass of the right pelvis.  There was also note of moderate fecal material throughout stool without visualized bowel lesion.  A pelvic ultrasound was then performed which noted a right ovarian mass measuring 9.2 x 8.8 x 9.8 cm with multiple septations with septal thickening and soft tissue with areas of vascular flow.  Tumor markers were then collected with mild elevation in CEA to 4.6 and normal CA125 of 23.9.  Today patient reports that she began having constipation and about October.  She eventually notified her primary care physician of this and was sent to GI.  She saw GI in 06/01/2024 and was started on Linzess.  She has also been taking a fiber supplement.  Following few weeks of Linzess she started to have the rectal bleeding for which she presented to the emergency room.  She reports that the blood was bright red on the surface of the stool and on the toilet paper when she wiped.  She  reports her stools are otherwise brown.  This continues on and off.  She has been using the hydrocortisone  suppository as instructed.  She is having bowel movements about 2-3 times a day but they are only hard small pellets.  She otherwise notes also early satiety for about a month and abdominal bloating since the emergency room visit.  She reports 7 pound weight loss over the past 4 months.  She denies any changes to her bladder habits.  Patient is status post hysterectomy at a young age.  She is uncertain of the exact reason for the hysterectomy.   PAST MEDICAL HISTORY: Past Medical History:  Diagnosis Date   Bell's palsy    Heartburn    History of colon polyps    Hyperlipidemia    Hypertension    PMR (polymyalgia rheumatica)     PAST SURGICAL HISTORY: Past Surgical History:  Procedure Laterality Date   BREAST BIOPSY Left 07/24/2021   benign   COLONOSCOPY     VAGINAL HYSTERECTOMY      OB/GYN HISTORY: OB History  Gravida Para Term Preterm AB Living  2 2 2   2   SAB IAB Ectopic Multiple Live Births      2    # Outcome Date GA Lbr Len/2nd Weight Sex Type Anes PTL Lv  2 Term      Vag-Spont   LIV  1 Term      Vag-Spont  LIV      Age at menarche: 69 Age at menopause: age 89 hysterectomy Hx of HRT: estrace for several decades (per pt, prescribed by PCP) Hx of STI: no Last pap: unknown History of abnormal pap smears: no  SCREENING STUDIES:  Last mammogram: 12/2023 Last colonoscopy: 2019 per pt ([facility])  MEDICATIONS: Current Medications[1]  ALLERGIES: Allergies[2]  FAMILY HISTORY: Family History  Problem Relation Age of Onset   Alzheimer's disease Mother    CAD Mother    COPD Father    Diabetes Sister    Diabetes Brother    Breast cancer Maternal Aunt        great aunt   Ovarian cancer Maternal Cousin        daughter of great aunt with breast cancer   Colon cancer Neg Hx    Endometrial cancer Neg Hx    Pancreatic cancer Neg Hx    Prostate cancer Neg Hx      SOCIAL HISTORY: Social History   Socioeconomic History   Marital status: Divorced    Spouse name: Not on file   Number of children: Not on file   Years of education: Not on file   Highest education level: Not on file  Occupational History   Not on file  Tobacco Use   Smoking status: Never    Passive exposure: Never   Smokeless tobacco: Never  Vaping Use   Vaping status: Never Used  Substance and Sexual Activity   Alcohol use: No   Drug use: No   Sexual activity: Not Currently    Partners: Male  Other Topics Concern   Not on file  Social History Narrative   Not on file   Social Drivers of Health   Tobacco Use: Low Risk (01/12/2024)   Patient History    Smoking Tobacco Use: Never    Smokeless Tobacco Use: Never    Passive Exposure: Never  Financial Resource Strain: Not on file  Food Insecurity: No Food Insecurity (06/17/2024)   Epic    Worried About Programme Researcher, Broadcasting/film/video in the Last Year: Never true    Ran Out of Food in the Last Year: Never true  Transportation Needs: No Transportation Needs (06/17/2024)   Epic    Lack of Transportation (Medical): No    Lack of Transportation (Non-Medical): No  Physical Activity: Not on file  Stress: Not on file  Social Connections: Not on file  Intimate Partner Violence: Not At Risk (06/17/2024)   Epic    Fear of Current or Ex-Partner: No    Emotionally Abused: No    Physically Abused: No    Sexually Abused: No  Depression (PHQ2-9): Low Risk (06/17/2024)   Depression (PHQ2-9)    PHQ-2 Score: 0  Alcohol Screen: Not on file  Housing: Unknown (06/17/2024)   Epic    Unable to Pay for Housing in the Last Year: No    Number of Times Moved in the Last Year: Not on file    Homeless in the Last Year: No  Utilities: Not At Risk (06/17/2024)   Epic    Threatened with loss of utilities: No  Health Literacy: Not on file    REVIEW OF SYSTEMS: New patient intake form was reviewed.  Complete 10-system review is negative except for the  following: Appetite changes, constipation, fatigue, abdominal pain, weight loss, blood in stool  PHYSICAL EXAM: BP 133/67 (BP Location: Right Arm, Patient Position: Sitting)   Pulse 61   Temp 98.6 F (37 C) (  Oral)   Resp 18   Ht 5' 3 (1.6 m)   Wt 132 lb (59.9 kg)   SpO2 99%   BMI 23.38 kg/m  Constitutional: No acute distress. Neuro/Psych: Alert, oriented.  Head and Neck: Normocephalic, atraumatic. Neck symmetric without masses. Sclera anicteric.  Respiratory: Normal work of breathing. Clear to auscultation bilaterally. Cardiovascular: Regular rate and rhythm, no murmurs, rubs, or gallops. Abdomen: Normoactive bowel sounds. Soft, non-distended, non-tender to palpation. No masses appreciated.  Extremities: Grossly normal range of motion. Warm, well perfused. No edema bilaterally. Skin: No rashes or lesions. Lymphatic: No cervical, supraclavicular, or inguinal adenopathy. Genitourinary: External genitalia without lesions. Urethral meatus without lesions or prolapse. On speculum exam, vagina without lesions. Bimanual exam reveals right adnexal mass which is overall soft and somewhat mobile.  Position more anteriorly and possibly stuck to the cuff. Rectovaginal exam with adnexal mass completely separate from rectum.  However, firm mass palpated in the proximal rectum.  Could possibly be firm stool ball but concerning for fixed mass.  Bright red blood on tip of gloved finger following exam. Exam chaperoned by Eleanor Epps, NP   LABORATORY AND RADIOLOGIC DATA: Outside medical records were reviewed to synthesize the above history, along with the history and physical obtained during the visit.  Outside laboratory, pathology, and imaging reports were reviewed, with pertinent results below.  I personally reviewed the outside images.  WBC  Date Value Ref Range Status  06/15/2024 6.3 4.0 - 10.5 K/uL Final   Hemoglobin  Date Value Ref Range Status  06/16/2024 11.6 (L) 12.0 - 15.0 g/dL Final    HCT  Date Value Ref Range Status  06/16/2024 35.7 (L) 36.0 - 46.0 % Final    Comment:    Performed at [facility], [address], [place], [place] [address]   Platelets  Date Value Ref Range Status  06/15/2024 348 150 - 400 K/uL Final   Creat  Date Value Ref Range Status  10/22/2021 0.73 0.60 - 1.00 mg/dL Final   Creatinine, Ser  Date Value Ref Range Status  06/15/2024 1.28 (H) 0.44 - 1.00 mg/dL Final   AST  Date Value Ref Range Status  06/15/2024 20 15 - 41 U/L Final   ALT  Date Value Ref Range Status  06/15/2024 12 0 - 44 U/L Final   Cancer Antigen (CA) 125  Date Value Ref Range Status  06/16/2024 23.9 0.0 - 38.1 U/mL Final    Comment:    (NOTE) Roche [employer] Electrochemiluminescence Immunoassay (ECLIA) Values obtained with different assay methods or kits cannot be used interchangeably.  Results cannot be interpreted as absolute evidence of the presence or absence of malignant disease. Performed At: [facility] [address] [place], [place] [id number] Jennette Shorter MD Ey:[id number]    CEA (06/16/24): 4.6  US  PELVIS (TRANSABDOMINAL ONLY) 06/16/2024  Narrative EXAM: PELVIC ULTRASOUND 06/16/2024 03:06:44 PM  TECHNIQUE: Transabdominal pelvic duplex ultrasound using B-mode/gray scaled imaging with Doppler spectral analysis and color flow was obtained.  COMPARISON: None available.  CLINICAL HISTORY: Ovarian cystic mass.  FINDINGS:  ULTRASOUND FINDINGS: UTERUS: Hysterectomy.  ENDOMETRIAL STRIPE: Not applicable due to hysterectomy.  RIGHT OVARY: Right ovary measures 9.2 x 8.8 x 9.8 cm. Large multiseptated cyst filling the right ovary, measuring 9.2 x 8.8 x 9.8 cm. Septal thickening and soft tissue is appreciated with areas of vascular Doppler flow noted.  LEFT OVARY: The left ovary was not visualized, likely due to age related atrophy and overlying bowel gas.  FREE FLUID:  No free fluid.  IMPRESSION: 1.  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 Gynecologic oncology consultation recommended. A nonemergent MRI pelvis with IV contrast may also be of benefit for preoperative characterization.  Electronically signed by: Rogelia Myers MD 06/16/2024 03:46 PM EST RP Workstation: [id number]   CT Angio Abd/Pel W and/or Wo Contrast 06/16/2024  Narrative CLINICAL DATA:  Lower GI bleed.  EXAM: CT ANGIOGRAPHY ABDOMEN AND PELVIS WITH CONTRAST AND WITHOUT CONTRAST  TECHNIQUE: Multidetector CT imaging of the abdomen and pelvis was performed using the standard protocol during bolus administration of intravenous contrast. Multiplanar reconstructed images and MIPs were obtained and reviewed to evaluate the vascular anatomy.  RADIATION DOSE REDUCTION: This exam was performed according to the departmental dose-optimization program which includes automated exposure control, adjustment of the mA and/or kV according to patient size and/or use of iterative reconstruction technique.  CONTRAST:  75mL OMNIPAQUE  IOHEXOL  350 MG/ML SOLN  COMPARISON:  None Available.  FINDINGS: VASCULAR  Aorta: Atherosclerosis of the abdominal aorta without evidence of aneurysm or dissection.  Celiac: Normally patent. The celiac axis supplies the left gastric and splenic arteries.  SMA: Normally patent. Normal variant replaced common hepatic artery off of the SMA.  Renals: Normally patent bilateral single renal arteries.  IMA: Normally patent.  Inflow: Atherosclerosis of bilateral common and internal iliac arteries without significant obstructive disease or aneurysmal disease. Normally patent bilateral external iliac arteries.  Proximal Outflow: Normally patent bilateral common femoral arteries and femoral bifurcations.  Veins: Venous phase imaging demonstrates normally patent venous structures in the abdomen and  pelvis.  Review of the MIP images confirms the above findings.  NON-VASCULAR  Lower chest: No acute abnormality.  Hepatobiliary: 10 mm probable benign cyst in the anterior subcapsular right lobe of the liver. Calcified gallstones in the gallbladder without gallbladder distension or inflammation. No biliary ductal dilatation.  Pancreas: Unremarkable. No pancreatic ductal dilatation or surrounding inflammatory changes.  Spleen: Normal in size without focal abnormality.  Adrenals/Urinary Tract: Adrenal glands are unremarkable. Kidneys are normal, without renal calculi, focal lesion, or hydronephrosis. Bladder is unremarkable.  Stomach/Bowel: No evidence of active gastrointestinal bleeding on arterial or venous phases of imaging. Moderate fecal material throughout the stool. No bowel obstruction, ileus or free intraperitoneal air. No visualized bowel masses by CT. The appendix is normal.  Lymphatic: No enlarged abdominal or pelvic lymph nodes.  Reproductive: Large complex cystic mass of the right pelvis likely is of adnexal/ovarian origin and demonstrates irregular, thick internal septations. Maximum mass dimensions are approximately 10.7 x 7.9 x 9.3 cm. 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 The uterus has been removed. Discrete left-sided ovarian tissue is not clearly delineated by CT.  Other: No abdominal wall hernia or abnormality. No abdominopelvic ascites.  Musculoskeletal: No acute or significant osseous findings.  IMPRESSION: 1. No evidence of active gastrointestinal bleeding on arterial or venous phases of imaging. 2. Moderate fecal material throughout the stool without visualized bowel lesion. 3. Large complex cystic mass of the right pelvis likely is of adnexal/ovarian origin and demonstrates irregular, thick internal septations. Maximum mass dimensions are approximately 10.7 x 7.9 x 9.3 cm. 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  Initial characterization with pelvic ultrasound recommended. Recommend Gynecologic Oncology consultation. 4. Cholelithiasis without evidence of acute cholecystitis. 5. Aortic atherosclerosis without evidence of aneurysm or dissection.   Electronically Signed By: Marcey Moan M.D. On: 06/16/2024 12:57     [1]  Current Outpatient Medications:  amLODipine (NORVASC) 5 MG tablet, Take 1 tablet by mouth 2 (two) times daily., Disp: , Rfl:    aspirin 81 MG tablet, Take 81 mg by mouth daily., Disp: , Rfl:    CRESTOR 20 MG tablet, Take 10 mg by mouth daily., Disp: , Rfl: 0   estradiol (ESTRACE) 1 MG tablet, Take 1 tablet by mouth daily., Disp: , Rfl:    famotidine (PEPCID) 20 MG tablet, Take 20 mg by mouth as needed for heartburn or indigestion., Disp: , Rfl:    hydrochlorothiazide (HYDRODIURIL) 12.5 MG tablet, 1 tablet in the morning, Disp: , Rfl:    hydrocortisone  (ANUSOL -HC) 25 MG suppository, Place 1 suppository (25 mg total) rectally 2 (two) times daily as needed (rectal bleeding)., Disp: 12 suppository, Rfl: 0   losartan (COZAAR) 100 MG tablet, Take 100 mg by mouth daily., Disp: , Rfl:    Omega-3 Fatty Acids (FISH OIL) 1200 MG CAPS, 1 capsule, Disp: , Rfl:    rosuvastatin (CRESTOR) 10 MG tablet, Take 10 mg by mouth daily., Disp: , Rfl:  [2]  Allergies Allergen Reactions   Hydrocodone-Acetaminophen     Other reaction(s): visual hallucinations

## 2024-06-21 NOTE — Progress Notes (Signed)
 Patient here for a consult with Dr. Eldonna and for a pre-operative appointment prior to her tentatively scheduled surgery on 07/14/2024. She is scheduled for a robotic assisted laparoscopic bilateral salpingo-oophorectomy, possible staging and debulking, possible laparotomy. The surgery was discussed in detail.  See after visit summary for additional details.       Discussed post-op pain management in detail including the aspects of the enhanced recovery pathway.  Advised her that a new prescription would be sent in for Tramadol and it is only to be used for after her upcoming surgery.  We discussed the use of tylenol post-op and to monitor for a maximum of 4,000 mg in a 24 hour period.  Also prescribed sennakot to be used after surgery and to hold if having loose stools.  Discussed bowel regimen in detail.     Discussed the use of SCDs and measures to take at home to prevent DVT including frequent mobility.  Reportable signs and symptoms of DVT discussed. Post-operative instructions discussed and expectations for after surgery. Incisional care discussed as well including reportable signs and symptoms including erythema, drainage, wound separation.     30 minutes spent with the patient.  Verbalizing understanding of material discussed. No needs or concerns voiced at the end of the visit.   Advised patient to call for any needs.  Advised that her post-operative medications had been prescribed and could be picked up at any time.    This appointment is included in the global surgical bundle as pre-operative teaching and has no charge.

## 2024-06-22 LAB — CANCER ANTIGEN 19-9: CA 19-9: 23 U/mL (ref 0–35)

## 2024-06-28 ENCOUNTER — Ambulatory Visit (HOSPITAL_COMMUNITY)

## 2024-06-30 ENCOUNTER — Ambulatory Visit (HOSPITAL_COMMUNITY)

## 2024-06-30 ENCOUNTER — Encounter (HOSPITAL_COMMUNITY): Admission: RE | Admit: 2024-06-30

## 2024-07-12 ENCOUNTER — Ambulatory Visit (HOSPITAL_COMMUNITY): Admit: 2024-07-12 | Admitting: Psychiatry

## 2024-07-12 DIAGNOSIS — N9489 Other specified conditions associated with female genital organs and menstrual cycle: Secondary | ICD-10-CM

## 2024-07-25 ENCOUNTER — Inpatient Hospital Stay: Admitting: Psychiatry
